# Patient Record
Sex: Female | Born: 1995 | Race: Black or African American | Hispanic: No | Marital: Single | Smoking: Never smoker
Health system: Southern US, Community
[De-identification: ages and names within clinical notes are randomized; demographics above are authoritative.]

## PROBLEM LIST (undated history)

## (undated) DIAGNOSIS — J45909 Unspecified asthma, uncomplicated: Secondary | ICD-10-CM

## (undated) DIAGNOSIS — F419 Anxiety disorder, unspecified: Secondary | ICD-10-CM

## (undated) DIAGNOSIS — F32A Depression, unspecified: Secondary | ICD-10-CM

## (undated) DIAGNOSIS — T783XXA Angioneurotic edema, initial encounter: Secondary | ICD-10-CM

## (undated) DIAGNOSIS — L509 Urticaria, unspecified: Secondary | ICD-10-CM

## (undated) HISTORY — DX: Anxiety disorder, unspecified: F41.9

## (undated) HISTORY — DX: Depression, unspecified: F32.A

## (undated) HISTORY — PX: APPENDECTOMY: SHX54

## (undated) HISTORY — DX: Angioneurotic edema, initial encounter: T78.3XXA

## (undated) HISTORY — DX: Urticaria, unspecified: L50.9

---
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
## 2017-01-24 DIAGNOSIS — M545 Low back pain: Secondary | ICD-10-CM | POA: Diagnosis not present

## 2017-01-24 DIAGNOSIS — B9689 Other specified bacterial agents as the cause of diseases classified elsewhere: Secondary | ICD-10-CM | POA: Diagnosis not present

## 2017-01-24 DIAGNOSIS — N760 Acute vaginitis: Secondary | ICD-10-CM | POA: Diagnosis not present

## 2017-01-24 DIAGNOSIS — R102 Pelvic and perineal pain: Secondary | ICD-10-CM | POA: Diagnosis not present

## 2017-02-07 ENCOUNTER — Encounter: Payer: Self-pay | Admitting: Physician Assistant

## 2017-02-20 ENCOUNTER — Ambulatory Visit: Payer: 59 | Admitting: Physician Assistant

## 2017-02-26 DIAGNOSIS — N760 Acute vaginitis: Secondary | ICD-10-CM | POA: Diagnosis not present

## 2017-02-26 DIAGNOSIS — R142 Eructation: Secondary | ICD-10-CM | POA: Diagnosis not present

## 2017-02-26 DIAGNOSIS — R110 Nausea: Secondary | ICD-10-CM | POA: Diagnosis not present

## 2017-02-26 DIAGNOSIS — K219 Gastro-esophageal reflux disease without esophagitis: Secondary | ICD-10-CM | POA: Diagnosis not present

## 2017-02-26 DIAGNOSIS — E7800 Pure hypercholesterolemia, unspecified: Secondary | ICD-10-CM | POA: Diagnosis not present

## 2017-02-26 DIAGNOSIS — E559 Vitamin D deficiency, unspecified: Secondary | ICD-10-CM | POA: Diagnosis not present

## 2017-02-26 DIAGNOSIS — N200 Calculus of kidney: Secondary | ICD-10-CM | POA: Diagnosis not present

## 2017-04-16 DIAGNOSIS — R5383 Other fatigue: Secondary | ICD-10-CM | POA: Diagnosis not present

## 2017-04-16 DIAGNOSIS — Z23 Encounter for immunization: Secondary | ICD-10-CM | POA: Diagnosis not present

## 2017-04-16 DIAGNOSIS — J00 Acute nasopharyngitis [common cold]: Secondary | ICD-10-CM | POA: Diagnosis not present

## 2017-04-16 DIAGNOSIS — R05 Cough: Secondary | ICD-10-CM | POA: Diagnosis not present

## 2017-07-03 DIAGNOSIS — F329 Major depressive disorder, single episode, unspecified: Secondary | ICD-10-CM | POA: Diagnosis not present

## 2017-07-03 DIAGNOSIS — F410 Panic disorder [episodic paroxysmal anxiety] without agoraphobia: Secondary | ICD-10-CM | POA: Diagnosis not present

## 2017-07-03 DIAGNOSIS — L731 Pseudofolliculitis barbae: Secondary | ICD-10-CM | POA: Diagnosis not present

## 2018-03-19 IMAGING — US US ART/VEN ABD/PELV/SCROTUM DOPPLER LTD
1 series · 14 of 25 positions shown · non-contrast
Comparison: None.

CLINICAL DATA: Lower abdominal/pelvis pain on the right

EXAM:
TRANSABDOMINAL ULTRASOUND OF PELVIS
DOPPLER ULTRASOUND OF OVARIES
TECHNIQUE: Transabdominal ultrasound examination of the pelvis was performed
including evaluation of the uterus, ovaries, adnexal regions, and
pelvic cul-de-sac.
Color and duplex Doppler ultrasound was utilized to evaluate blood
flow to the ovaries.

[Series 1: us art/ven abd/pelv/scrotum doppler ltd · 0.24mm/px · 14 of 36 slices shown]
[im 1/36]
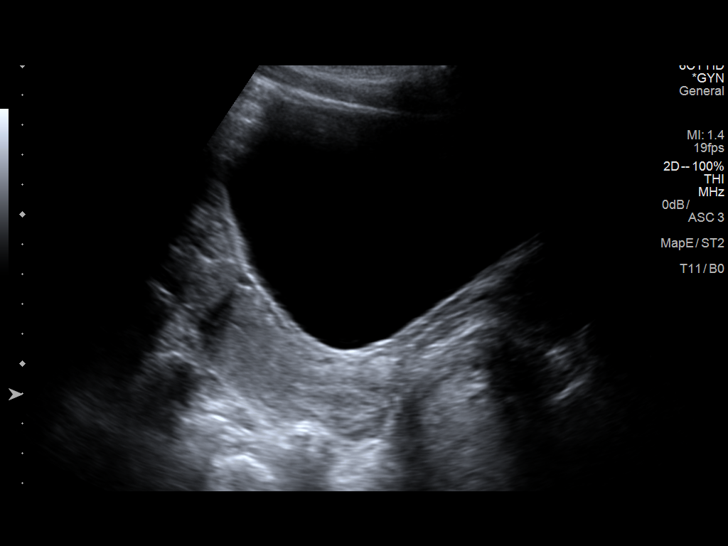
[im 3/36]
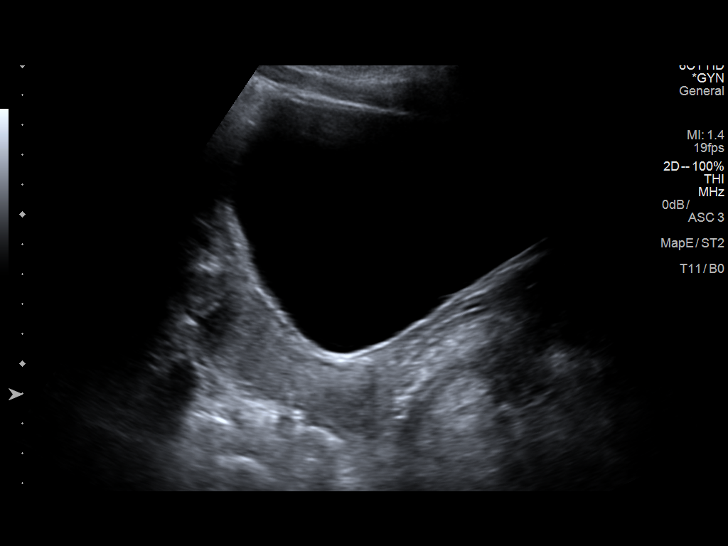
[im 6/36]
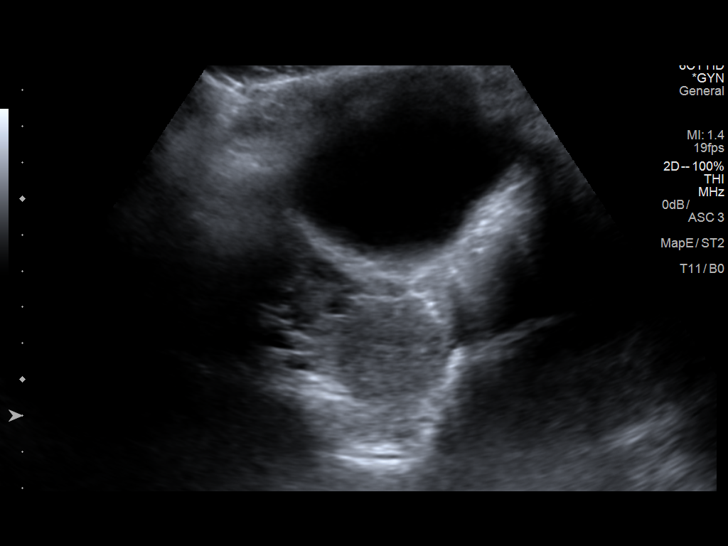
[im 9/36]
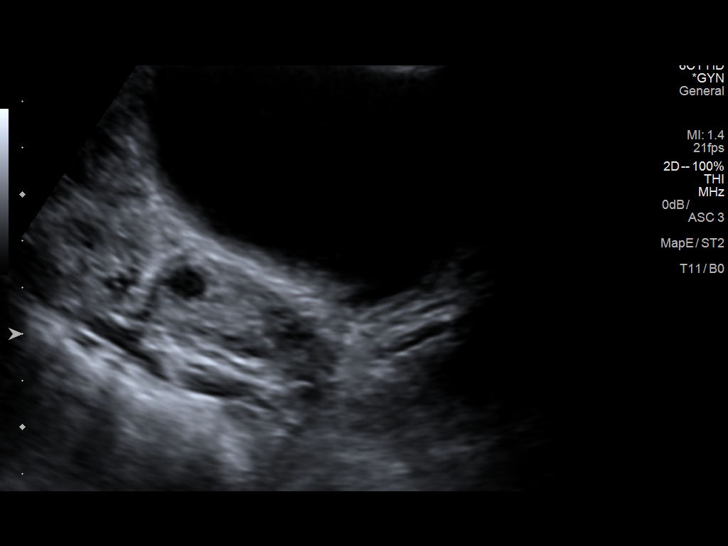
[im 12/36]
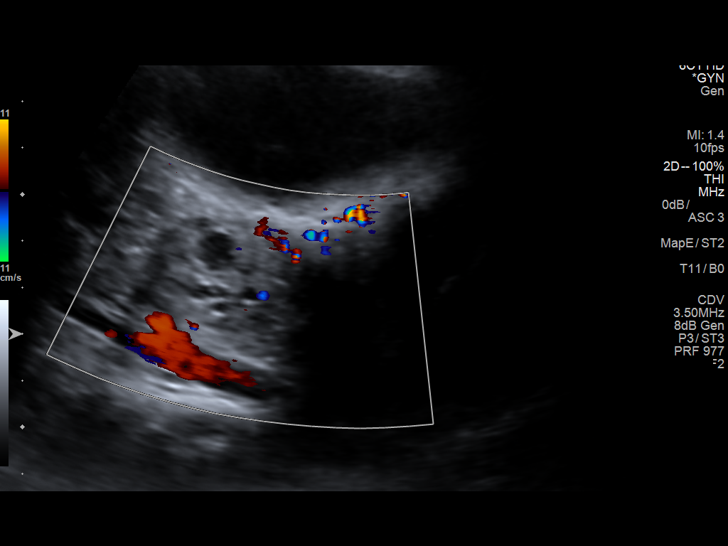
[im 14/36]
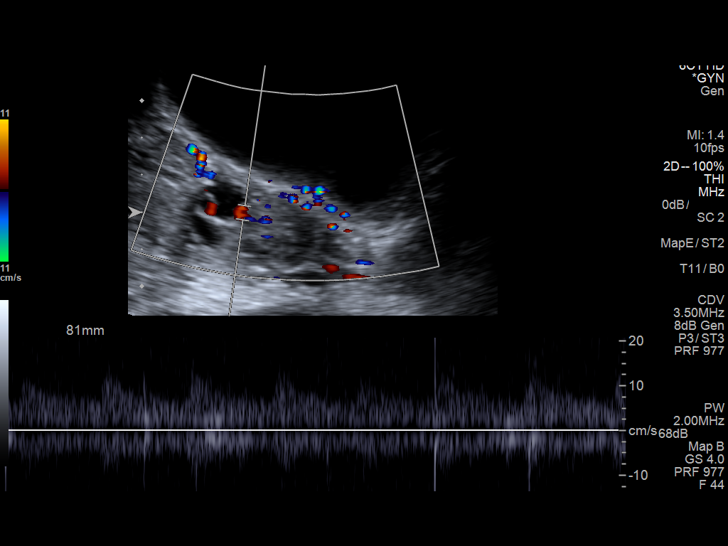
[im 17/36]
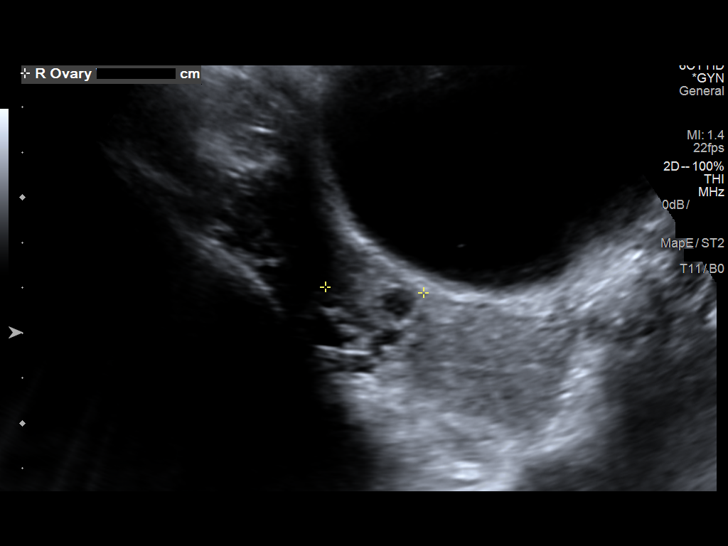
[im 19/36]
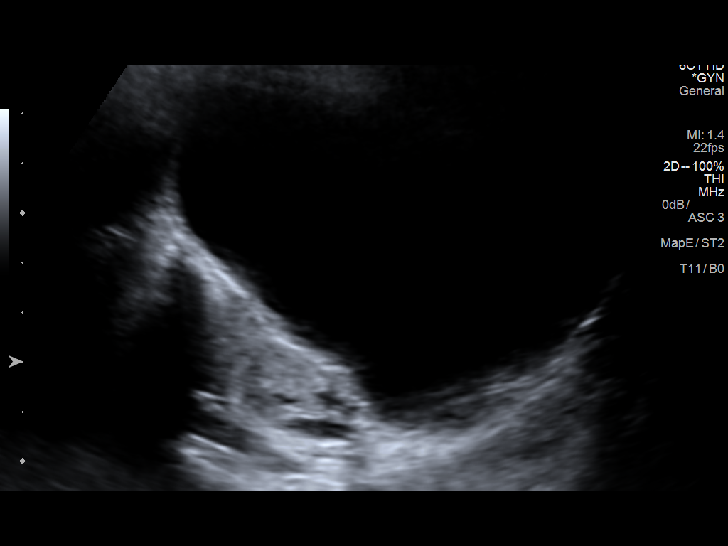
[im 22/36]
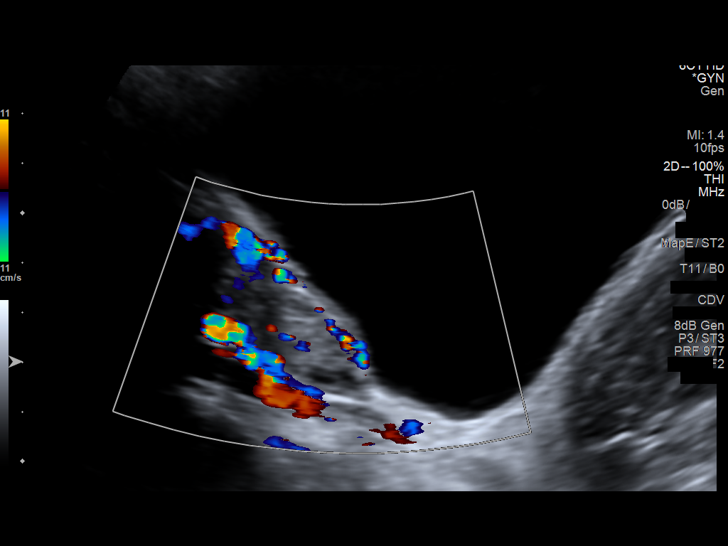
[im 24/36]
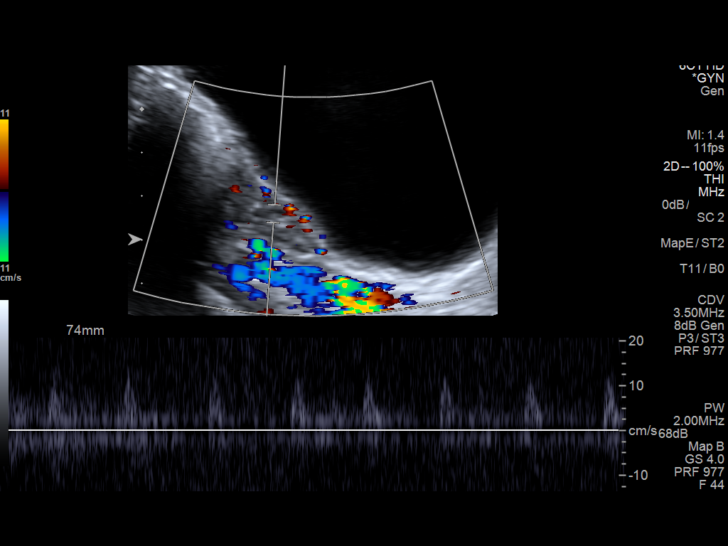
[im 27/36]
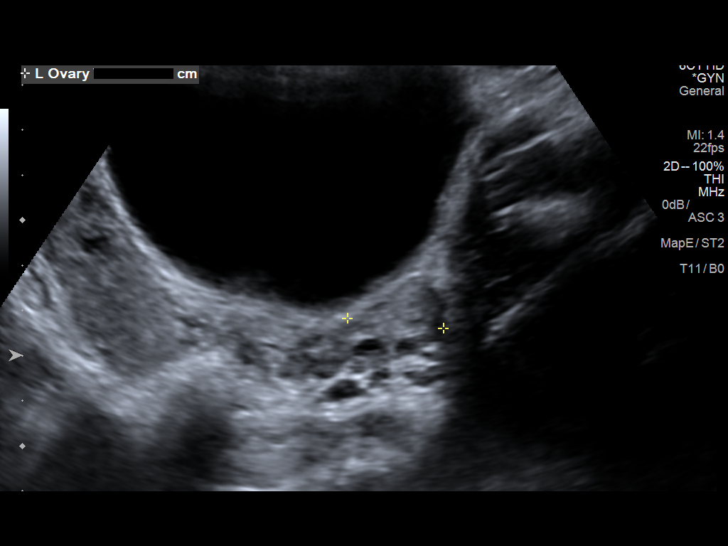
[im 30/36]
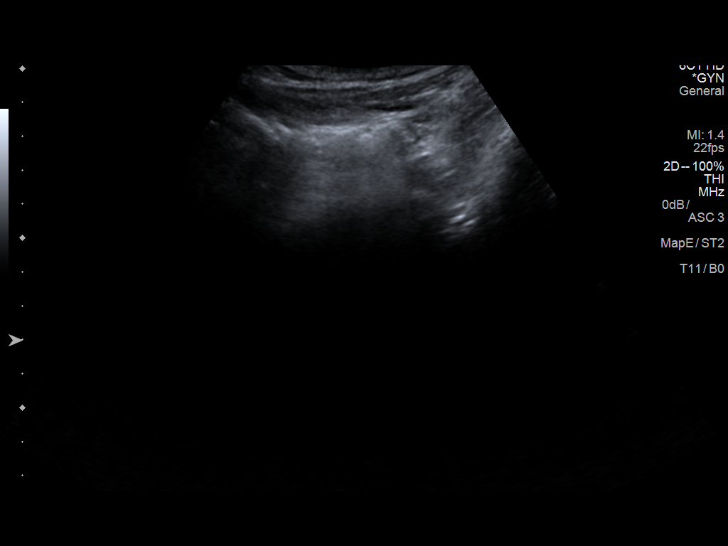
[im 33/36]
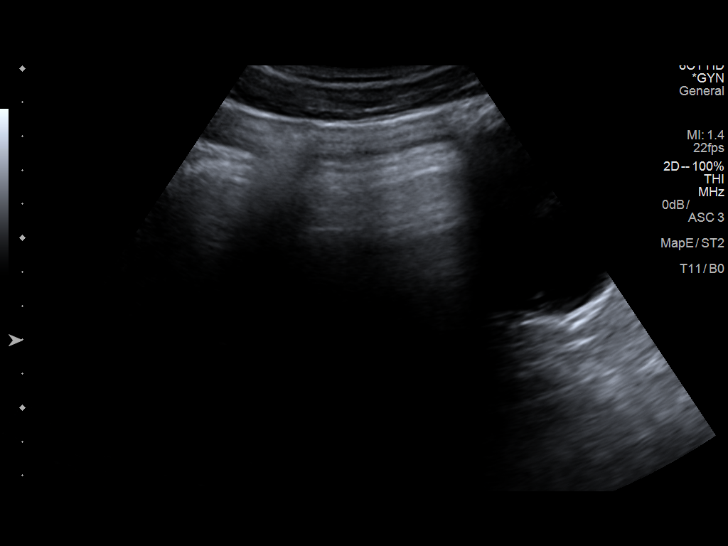
[im 36/36]
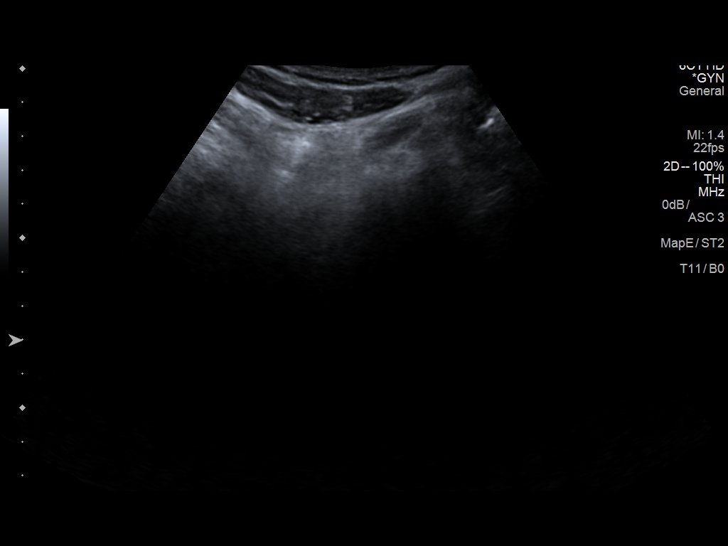

[14 of 25 positions shown; findings below may reference images not displayed]

FINDINGS: Uterus

Measurements: 7.0 x 2.4 x 4.6 cm. No fibroids or other mass
visualized. Uterus is anteverted.

Endometrium

Thickness: 3 mm. No focal abnormality visualized.

Right ovary

Measurements: 3.9 x 2.0 x 2.2 cm. Normal appearance/no adnexal mass.
Physiologic follicles are noted.

Left ovary

Measurements: 2.8 x 1.7 x 2.1 cm. Normal appearance/no adnexal mass.
Physiologic follicles are noted.

Pulsed Doppler evaluation demonstrates normal low-resistance
arterial and venous waveforms in both ovaries.

No free pelvic fluid.
IMPRESSION: No demonstrable intrauterine or extrauterine pelvic mass or fluid
collection. No evidence of ovarian torsion. No abnormality
appreciable on this study.

## 2018-10-17 ENCOUNTER — Telehealth: Payer: 59 | Admitting: Family

## 2018-10-17 DIAGNOSIS — L509 Urticaria, unspecified: Secondary | ICD-10-CM

## 2018-10-17 NOTE — Progress Notes (Signed)
Based on what you shared with me, I feel your condition warrants further evaluation and I recommend that you be seen for a face to face office visit.     NOTE: If you entered your credit card information for this eVisit, you will not be charged. You may see a "hold" on your card for the $35 but that hold will drop off and you will not have a charge processed.  If you are having a true medical emergency please call 911.  If you need an urgent face to face visit, [place] has four urgent care centers for your convenience.    PLEASE NOTE: THE [facility] AND URGENT CARE CLINICS DO NOT HAVE THE TESTING FOR CORONAVIRUS COVID19 AVAILABLE.  IF YOU FEEL YOU NEED THIS TEST YOU MUST GO TO A TRIAGE LOCATION AT ONE OF THE HOSPITAL EMERGENCY DEPARTMENTS   [email or web address] to reserve your spot online an avoid wait times  [facility] [address], [address], [place] [address] Modified hours of operation: Monday-Friday, 10 AM to 6 PM  Saturday & Sunday 10 AM to 4 PM *Across the street from [employer]  [facility] (New Address!) [address], [address] [place], [place] [address] *Just off [address], across the road from [employer]* Modified hours of operation: Monday-Friday, 10 AM to 5 PM  Closed Saturday & Sunday   The following sites will take your insurance:  . [facility]  [phone] Get Driving Directions Find a Provider at this Location  [address] [place], [place] [address] . 10 am to 8 pm Monday-Friday . 12 pm to 8 pm Saturday-Sunday   . [facility]  [phone] Get Driving Directions Find a Provider at this Location  [address], [address] [place] [place] [address] . 8 am to 8 pm Monday-Friday . 9 am to 6 pm Saturday . 11 am to 6 pm Sunday   . [facility] [place]  [phone] Get Driving Directions  [address] [place],  [address] . 8 am to 8 pm Monday-Friday . 8 am to 4 pm Saturday-Sunday   Your e-visit answers were reviewed by a board certified advanced clinical practitioner to complete your personal care plan.  Thank you for using e-Visits. 

## 2018-10-18 ENCOUNTER — Other Ambulatory Visit: Payer: Self-pay

## 2018-10-18 ENCOUNTER — Encounter (HOSPITAL_COMMUNITY): Payer: Self-pay

## 2018-10-18 ENCOUNTER — Ambulatory Visit (HOSPITAL_COMMUNITY)
Admission: EM | Admit: 2018-10-18 | Discharge: 2018-10-18 | Disposition: A | Discharge status: Home / Self Care | Payer: 59 | Attending: Emergency Medicine | Admitting: Emergency Medicine

## 2018-10-18 DIAGNOSIS — J069 Acute upper respiratory infection, unspecified: Secondary | ICD-10-CM | POA: Insufficient documentation

## 2018-10-18 DIAGNOSIS — B9789 Other viral agents as the cause of diseases classified elsewhere: Secondary | ICD-10-CM | POA: Insufficient documentation

## 2018-10-18 LAB — POCT RAPID STREP A: Streptococcus, Group A Screen (Direct): NEGATIVE

## 2018-10-18 MED ORDER — CETIRIZINE-PSEUDOEPHEDRINE ER 5-120 MG PO TB12: 1.0000 | ORAL_TABLET | Freq: Every day | ORAL | 0 refills | Status: DC

## 2018-10-18 MED ORDER — FLUTICASONE PROPIONATE 50 MCG/ACT NA SUSP: 2.0000 | Freq: Every day | NASAL | 0 refills | Status: DC

## 2018-10-18 MED ORDER — LIDOCAINE VISCOUS HCL 2 % MT SOLN: 15.0000 mL | OROMUCOSAL | 0 refills | Status: DC | PRN

## 2018-10-18 NOTE — Discharge Instructions (Signed)
Strep was negative.  We will send out to culture and notify you of abnormal results.   Get plenty of rest and push fluids Zyrtec-D prescribed for nasal congestion, runny nose, and/or sore throat Flonase prescribed for nasal congestion and runny nose Viscous lidocaine prescribed.  This is an oral solution you can swish, gargle, and/or swallow as needed for symptomatic relief of sore throat.  Do not exceed 8 doses in a 24 hour period.  Do not use prior to eating, as this will numb your entire mouth.    Use OTC medications like ibuprofen or tylenol as needed fever or pain Follow up with PCP or [facility] if symptoms persist Return or go to ER if you have any new or worsening symptoms fever, chills, nausea, vomiting, chest pain, cough, shortness of breath, wheezing, abdominal pain, changes in bowel or bladder habits, etc...  You should remain isolated in your home for 7 days from symptom onset AND greater than 72 hours after symptoms resolution (absence of fever without the use of fever-reducing medication and improvement in respiratory symptoms), whichever is longer

## 2018-10-18 NOTE — ED Triage Notes (Signed)
Patient presents to Urgent Care with complaints of flu like symptoms, including generalized body aches, runny nose, sore throat, chills since 3-4 days ago. Patient states she has been taking lots of otc meds for her symptoms and nothing really helps, did not take anything today. Temp 99.1 during triage.

## 2018-10-18 NOTE — ED Notes (Signed)
Patient verbalizes understanding of discharge instructions. Opportunity for questioning and answers were provided. Patient discharged from U[facility] by RN.  

## 2018-10-18 NOTE — ED Provider Notes (Signed)
[facility]   [id number] 10/18/18 Arrival Time: 1037   CC: URI symptoms   SUBJECTIVE: History from: patient.  Carol Hayes is a 23 y.o. female who presents with sore throat, fever, with tmax of 101 at home, HA, body aches, runny nose, congestion, and mild cough x 1 week.  Denies sick exposure to COVID, flu or strep.  Denies recent travel. Has tried OTC medications with minimal relief.  Symptoms are made worse with swallowing, but tolerating liquids and own secretions.  Reports previous symptoms in the past.   Denies sinus pain, SOB, wheezing, chest pain, nausea, changes in bowel or bladder habits.    ROS: As per HPI.  Past Medical History:  Diagnosis Date  . Asthma    Past Surgical History:  Procedure Laterality Date  . APPENDECTOMY     No Known Allergies No current facility-administered medications on file prior to encounter.    Current Outpatient Medications on File Prior to Encounter  Medication Sig Dispense Refill  . albuterol (PROVENTIL HFA;VENTOLIN HFA) 108 (90 Base) MCG/ACT inhaler Inhale into the lungs every 6 (six) hours as needed for wheezing or shortness of breath.     Social History   Socioeconomic History  . Marital status: Single    Spouse name: Not on file  . Number of children: Not on file  . Years of education: Not on file  . Highest education level: Not on file  Occupational History  . Not on file  Social Needs  . Financial resource strain: Not on file  . Food insecurity:    Worry: Not on file    Inability: Not on file  . Transportation needs:    Medical: Not on file    Non-medical: Not on file  Tobacco Use  . Smoking status: Never Smoker  . Smokeless tobacco: Never Used  Substance and Sexual Activity  . Alcohol use: No  . Drug use: No  . Sexual activity: Not on file  Lifestyle  . Physical activity:    Days per week: Not on file    Minutes per session: Not on file  . Stress: Not on file  Relationships  . Social  connections:    Talks on phone: Not on file    Gets together: Not on file    Attends religious service: Not on file    Active member of club or organization: Not on file    Attends meetings of clubs or organizations: Not on file    Relationship status: Not on file  . Intimate partner violence:    Fear of current or ex partner: Not on file    Emotionally abused: Not on file    Physically abused: Not on file    Forced sexual activity: Not on file  Other Topics Concern  . Not on file  Social History Narrative  . Not on file   Family History  Problem Relation Age of Onset  . Asthma Mother     OBJECTIVE:  Vitals:   10/18/18 1101  BP: 117/69  Pulse: 76  Resp: 17  Temp: 99.1 F (37.3 C)  TempSrc: Oral  SpO2: 100%     General appearance: alert; appears mildly fatigued, but nontoxic; speaking in full sentences and tolerating own secretions HEENT: NCAT; Ears: EACs clear, TMs pearly gray; Eyes: PERRL.  EOM grossly intact. Sinuses: nontender; Nose: nares patent without rhinorrhea, Throat: oropharynx clear, tonsils mildly erythematous NOT enlarged, uvula midline  Neck: supple without LAD Lungs: unlabored respirations, symmetrical air entry;  cough: absent; no respiratory distress; CTAB Heart: regular rate and rhythm.  Radial pulses 2+ symmetrical bilaterally Skin: warm and dry Psychological: alert and cooperative; normal mood and affect  RESULTS:  Results for orders placed or performed during the hospital encounter of 10/18/18 (from the past 24 hour(s))  POCT rapid strep A [facility])     Status: None   Collection Time: 10/18/18 11:31 AM  Result Value Ref Range   Streptococcus, Group A Screen (Direct) NEGATIVE NEGATIVE     ASSESSMENT & PLAN:  1. Viral URI with cough     Meds ordered this encounter  Medications  . cetirizine-pseudoephedrine (ZYRTEC-D) 5-120 MG tablet    Sig: Take 1 tablet by mouth daily.    Dispense:  30 tablet    Refill:  0    Order Specific  Question:   Supervising Provider    Answer:   Eustace Moore [[id number]]  . fluticasone (FLONASE) 50 MCG/ACT nasal spray    Sig: Place 2 sprays into both nostrils daily.    Dispense:  16 g    Refill:  0    Order Specific Question:   Supervising Provider    Answer:   Eustace Moore [[id number]]  . lidocaine (XYLOCAINE) 2 % solution    Sig: Use as directed 15 mLs in the mouth or throat as needed for mouth pain.    Dispense:  100 mL    Refill:  0    Order Specific Question:   Supervising Provider    Answer:   Eustace Moore [[id number]]   Strep was negative.  We will send out to culture and notify you of abnormal results.   Get plenty of rest and push fluids Zyrtec-D prescribed for nasal congestion, runny nose, and/or sore throat Flonase prescribed for nasal congestion and runny nose Viscous lidocaine prescribed.  This is an oral solution you can swish, gargle, and/or swallow as needed for symptomatic relief of sore throat.  Do not exceed 8 doses in a 24 hour period.  Do not use prior to eating, as this will numb your entire mouth.    Use OTC medications like ibuprofen or tylenol as needed fever or pain Follow up with PCP or [facility] if symptoms persist Return or go to ER if you have any new or worsening symptoms fever, chills, nausea, vomiting, chest pain, cough, shortness of breath, wheezing, abdominal pain, changes in bowel or bladder habits, etc...  You should remain isolated in your home for 7 days from symptom onset AND greater than 72 hours after symptoms resolution (absence of fever without the use of fever-reducing medication and improvement in respiratory symptoms), whichever is longer  Reviewed expectations re: course of current medical issues. Questions answered. Outlined signs and symptoms indicating need for more acute intervention. Patient verbalized understanding. After Visit Summary given.         Rennis Harding, PA-C 10/18/18 1135

## 2018-10-19 ENCOUNTER — Emergency Department (HOSPITAL_COMMUNITY): Payer: 59

## 2018-10-19 ENCOUNTER — Other Ambulatory Visit: Payer: Self-pay

## 2018-10-19 ENCOUNTER — Encounter (HOSPITAL_COMMUNITY): Payer: Self-pay

## 2018-10-19 ENCOUNTER — Emergency Department (HOSPITAL_COMMUNITY)
Admission: EM | Admit: 2018-10-19 | Discharge: 2018-10-19 | Disposition: A | Discharge status: Home / Self Care | Payer: 59 | Attending: Emergency Medicine | Admitting: Emergency Medicine

## 2018-10-19 DIAGNOSIS — J069 Acute upper respiratory infection, unspecified: Secondary | ICD-10-CM

## 2018-10-19 DIAGNOSIS — B9789 Other viral agents as the cause of diseases classified elsewhere: Secondary | ICD-10-CM | POA: Diagnosis not present

## 2018-10-19 DIAGNOSIS — R05 Cough: Secondary | ICD-10-CM | POA: Diagnosis present

## 2018-10-19 DIAGNOSIS — J45909 Unspecified asthma, uncomplicated: Secondary | ICD-10-CM | POA: Insufficient documentation

## 2018-10-19 DIAGNOSIS — Z91040 Latex allergy status: Secondary | ICD-10-CM | POA: Diagnosis not present

## 2018-10-19 LAB — I-STAT BETA HCG BLOOD, ED (MC, WL, AP ONLY): I-stat hCG, quantitative: <5 m[IU]/mL (ref <5)

## 2018-10-19 MED ORDER — AEROCHAMBER PLUS FLO-VU MISC
1.0000 | Freq: Once | Status: AC
  Administered 2018-10-19: 1

## 2018-10-19 MED ORDER — ALBUTEROL SULFATE HFA 108 (90 BASE) MCG/ACT IN AERS
1.0000 | INHALATION_SPRAY | RESPIRATORY_TRACT | Status: DC | PRN
  Administered 2018-10-19: 2 via RESPIRATORY_TRACT
  Filled 2018-10-19: qty 6.7

## 2018-10-19 MED ORDER — BENZONATATE 100 MG PO CAPS: 100.0000 mg | ORAL_CAPSULE | Freq: Three times a day (TID) | ORAL | 0 refills | Status: DC

## 2018-10-19 NOTE — ED Notes (Signed)
Pts throat red on assessment, no white spots.

## 2018-10-19 NOTE — Discharge Instructions (Addendum)
You have been diagnosed today with viral upper respiratory tract infection with cough.  At this time there does not appear to be the presence of an emergent medical condition, however there is always the potential for conditions to change. Please read and follow the below instructions.  Please return to the Emergency Department immediately for any new or worsening symptoms. Please be sure to follow up with your Primary Care Provider within one week regarding your visit today; please call their office to schedule an appointment even if you are feeling better for a follow-up visit. As we discussed testing for the novel COVID-19 virus is not indicated at this time.  As testing becomes more available this may change however per our current guidelines testing is not indicated.  It is possible that you are still experiencing the coronavirus so we advise that you self quarantine for the next 2 weeks and follow-up with your primary care provider.  You may continue to use your albuterol inhaler and spacer as previously prescribed to help with your symptoms.  Increase your water intake and get plenty of rest to avoid dehydration.  You may use over-the-counter Tylenol as directed on the packaging to help with your symptoms.  You may use the Tessalon prescribed to you today to help with your cough.  You may continue to use the other medications prescribed to you yesterday at the urgent care to help with your sore throat.  Return immediately to the emergency department for any new or worsening symptoms.  Please read the instructions below regarding self quarantine.  Get help right away if: You have shortness of breath that gets worse. You have severe or persistent: Headache. Ear pain. Sinus pain. Chest pain. You have chronic lung disease along with any of the following: Wheezing. Prolonged cough. Coughing up blood. A change in your usual mucus. You have a stiff neck. You have changes in  your: Vision. Hearing. Thinking. Mood.  Please read the additional information packets attached to your discharge summary.  Do not take your medicine if  develop an itchy rash, swelling in your mouth or lips, or difficulty breathing.  ============================  If you live with, or provide care at home for, a person confirmed to have, or being evaluated for, COVID-19 infection please follow these guidelines to prevent infection:  Follow healthcare providers instructions Make sure that you understand and can help the patient follow any healthcare provider instructions for all care.  Provide for the patients basic needs You should help the patient with basic needs in the home and provide support for getting groceries, prescriptions, and other personal needs.  Monitor the patients symptoms If they are getting sicker, call his or her medical provider a  This will help the healthcare providers office take steps to keep other people from getting infected. Ask the healthcare provider to call the local or state health department.  Limit the number of people who have contact with the patient If possible, have only one caregiver for the patient. Other household members should stay in another home or place of residence. If this is not possible, they should stay in another room, or be separated from the patient as much as possible. Use a separate bathroom, if available. Restrict visitors who do not have an essential need to be in the home.  Keep older adults, very young children, and other sick people away from the patient Keep older adults, very young children, and those who have compromised immune systems or chronic health conditions  away from the patient. This includes people with chronic heart, lung, or kidney conditions, diabetes, and cancer.  Ensure good ventilation Make sure that shared spaces in the home have good air flow, such as from an air conditioner or an opened  window, weather permitting.  Wash your hands often Wash your hands often and thoroughly with soap and water for at least 20 seconds. You can use an alcohol based hand sanitizer if soap and water are not available and if your hands are not visibly dirty. Avoid touching your eyes, nose, and mouth with unwashed hands. Use disposable paper towels to dry your hands. If not available, use dedicated cloth towels and replace them when they become wet.  Wear a facemask and gloves Wear a disposable facemask at all times in the room and gloves when you touch or have contact with the patients blood, body fluids, and/or secretions or excretions, such as sweat, saliva, sputum, nasal mucus, vomit, urine, or feces.  Ensure the mask fits over your nose and mouth tightly, and do not touch it during use. Throw out disposable facemasks and gloves after using them. Do not reuse. Wash your hands immediately after removing your facemask and gloves. If your personal clothing becomes contaminated, carefully remove clothing and launder. Wash your hands after handling contaminated clothing. Place all used disposable facemasks, gloves, and other waste in a lined container before disposing them with other household waste. Remove gloves and wash your hands immediately after handling these items.  Do not share dishes, glasses, or other household items with the patient Avoid sharing household items. You should not share dishes, drinking glasses, cups, eating utensils, towels, bedding, or other items After the person uses these items, you should wash them thoroughly with soap and water.  Wash laundry thoroughly Immediately remove and wash clothes or bedding that have blood, body fluids, and/or secretions or excretions, such as sweat, saliva, sputum, nasal mucus, vomit, urine, or feces, on them. Wear gloves when handling laundry from the patient. Read and follow directions on labels of laundry or clothing items and detergent.  In general, wash and dry with the warmest temperatures recommended on the label.  Clean all areas the individual has used often Clean all touchable surfaces, such as counters, tabletops, doorknobs, bathroom fixtures, toilets, phones, keyboards, tablets, and bedside tables, every day. Also, clean any surfaces that may have blood, body fluids, and/or secretions or excretions on them. Wear gloves when cleaning surfaces the patient has come in contact with. Use a diluted bleach solution (e.g., dilute bleach with 1 part bleach and 10 parts water) or a household disinfectant with a label that says EPA-registered for coronaviruses. To make a bleach solution at home, add 1 tablespoon of bleach to 1 quart (4 cups) of water. For a larger supply, add  cup of bleach to 1 gallon (16 cups) of water. Read labels of cleaning products and follow recommendations provided on product labels. Labels contain instructions for safe and effective use of the cleaning product including precautions you should take when applying the product, such as wearing gloves or eye protection and making sure you have good ventilation during use of the product. Remove gloves and wash hands immediately after cleaning.  Monitor yourself for signs and symptoms of illness Caregivers and household members are considered close contacts, should monitor their health, and will be asked to limit movement outside of the home to the extent possible. Follow the monitoring steps for close contacts listed on the symptom monitoring form.   ?  If you have additional questions, contact your local health department or call the epidemiologist on call at [phone] (available 24/7). ? This guidance is subject to change. For the most up-to-date guidance from [facility], please refer to their website: TripMetro.huhttps://www.cdc.gov/coronavirus/2019-ncov/hcp/guidance-prevent-spread.html

## 2018-10-19 NOTE — ED Provider Notes (Signed)
[facility]-EMERGENCY DEPT Provider Note   [id number] Arrival date & time: 10/19/18  1630    History   Chief Complaint Chief Complaint  Patient presents with  . Cough  . Generalized Body Aches  . Sore Throat    HPI Rielle Schlauch is a 23 y.o. female with history of asthma presenting today for 1 week of viral illness.  Patient reports that on 10/12/2018 she developed cough and shortness of breath that felt similar to previous asthma exacerbations.  Patient began using her rescue inhaler with some relief of her symptoms.  She states that these symptoms lasted for 2-3 days and begin resolving.  At this time she still endorses a mild nonproductive cough and intermittent shortness of breath particularly only when she coughs.  Additionally patient states that she feels a chest tightness/soreness only with her cough and is worsened with palpation as well.  She denies chest pain at rest.  Patient endorses generalized body aches without focal area of tenderness.  Patient states that she has felt febrile however is not measured a temperature today.  She reports taking Tylenol at 3 PM this afternoon.  Finally patient Dors is sore throat, bilateral burning sensation constant worsened with swallowing and slightly relieved medications given to her at urgent care yesterday.  Patient tested negative for strep pharyngitis yesterday at the urgent care.  She has been eating and drinking without difficulty she denies difficulty swallowing or difficulty breathing on my examination.  Of note patient denies any recent contacts with patients known positive COVID-19.  She denies any recent travel.   Additionally patient denies history of blood clot, recent immobilizations/surgeries, hemoptysis, history of cancer, exogenous estrogen use, extremity pain/swelling or recent injury.     HPI  Past Medical History:  Diagnosis Date  . Asthma     There are no active problems to display for  this patient.   Past Surgical History:  Procedure Laterality Date  . APPENDECTOMY       OB History   No obstetric history on file.      Home Medications    Prior to Admission medications   Medication Sig Start Date End Date Taking? Authorizing Provider  albuterol (PROVENTIL HFA;VENTOLIN HFA) 108 (90 Base) MCG/ACT inhaler Inhale into the lungs every 6 (six) hours as needed for wheezing or shortness of breath.    [provider]  cetirizine-pseudoephedrine (ZYRTEC-D) 5-120 MG tablet Take 1 tablet by mouth daily. 10/18/18   Wurst, [place], PA-C  fluticasone (FLONASE) 50 MCG/ACT nasal spray Place 2 sprays into both nostrils daily. 10/18/18   Wurst, [place], PA-C  lidocaine (XYLOCAINE) 2 % solution Use as directed 15 mLs in the mouth or throat as needed for mouth pain. 10/18/18   Rennis Harding, PA-C    Family History Family History  Problem Relation Age of Onset  . Asthma Mother     Social History Social History   Tobacco Use  . Smoking status: Never Smoker  . Smokeless tobacco: Never Used  Substance Use Topics  . Alcohol use: No  . Drug use: No     Allergies   Latex   Review of Systems Review of Systems  Constitutional: Positive for chills and fever. Negative for diaphoresis.  HENT: Positive for congestion, rhinorrhea and sore throat. Negative for drooling, facial swelling, trouble swallowing and voice change.   Eyes: Negative.  Negative for visual disturbance.  Respiratory: Positive for cough, chest tightness and shortness of breath (Reports as improving over the  past few days).   Cardiovascular: Negative.  Negative for chest pain (Reports chest tightness when coughing only).  Gastrointestinal: Negative.  Negative for abdominal pain, diarrhea, nausea and vomiting.  Genitourinary: Negative.  Negative for dysuria, hematuria, vaginal bleeding and vaginal discharge.  Musculoskeletal: Positive for arthralgias and myalgias. Negative for neck pain and neck  stiffness.  Neurological: Positive for headaches (Mild earlier today it has resolved). Negative for weakness and numbness.  All other systems reviewed and are negative.  Physical Exam Updated Vital Signs BP 124/72   Pulse 90   Temp 98.8 F (37.1 C) (Oral)   Resp (!) 21   Ht 5\' 10"  (1.778 m)   Wt 62.6 kg   SpO2 99%   BMI 19.80 kg/m   Physical Exam Constitutional:      General: She is not in acute distress.    Appearance: Normal appearance. She is well-developed. She is not ill-appearing or diaphoretic.  HENT:     Head: Normocephalic and atraumatic.     Jaw: There is normal jaw occlusion. No trismus.     Right Ear: External ear normal.     Left Ear: External ear normal.     Nose: Rhinorrhea present. Rhinorrhea is clear.     Right Sinus: No maxillary sinus tenderness or frontal sinus tenderness.     Left Sinus: No maxillary sinus tenderness or frontal sinus tenderness.     Mouth/Throat:     Lips: Pink.     Mouth: Mucous membranes are moist.     Pharynx: Uvula midline.     Comments: The patient has normal phonation and is in control of secretions. No stridor.  Midline uvula without edema. Soft palate rises symmetrically.  Mild symmetric tonsillar erythema without swelling or exudates. Tongue protrusion is normal, floor of mouth is soft. No trismus. No creptius on neck palpation. No gingival erythema or fluctuance noted. Mucus membranes moist. Eyes:     General: Vision grossly intact. Gaze aligned appropriately.     Extraocular Movements: Extraocular movements intact.     Conjunctiva/sclera: Conjunctivae normal.     Pupils: Pupils are equal, round, and reactive to light.     Comments: Visual fields grossly intact bilaterally.  Neck:     Musculoskeletal: Full passive range of motion without pain, normal range of motion and neck supple.     Trachea: Trachea and phonation normal. No tracheal tenderness or tracheal deviation.     Meningeal: Brudzinski's sign absent.   Cardiovascular:     Rate and Rhythm: Normal rate and regular rhythm.     Pulses:          Dorsalis pedis pulses are 2+ on the right side and 2+ on the left side.       Posterior tibial pulses are 2+ on the right side and 2+ on the left side.     Heart sounds: Normal heart sounds.  Pulmonary:     Effort: Pulmonary effort is normal. No tachypnea, accessory muscle usage or respiratory distress.     Breath sounds: Normal breath sounds and air entry. No stridor.  Chest:     Chest wall: Tenderness present. No deformity or crepitus.     Comments: Mild tenderness to palpation of the rib cage. Abdominal:     General: There is no distension.     Palpations: Abdomen is soft.     Tenderness: There is no abdominal tenderness. There is no guarding or rebound.  Musculoskeletal: Normal range of motion.     Right  lower leg: Normal. She exhibits no tenderness. No edema.     Left lower leg: Normal. She exhibits no tenderness. No edema.  Feet:     Right foot:     Protective Sensation: 5 sites tested. 5 sites sensed.     Left foot:     Protective Sensation: 5 sites tested. 5 sites sensed.  Skin:    General: Skin is warm and dry.  Neurological:     Mental Status: She is alert.     GCS: GCS eye subscore is 4. GCS verbal subscore is 5. GCS motor subscore is 6.     Comments: Speech is clear and goal oriented, follows commands Major Cranial nerves without deficit, no facial droop Normal strength in upper and lower extremities bilaterally including dorsiflexion and plantar flexion, strong and equal grip strength Sensation normal to light touch in all 4 extremities Moves extremities without ataxia, coordination intact, is able to stand up from bed without assistance Normal finger to nose and rapid alternating movements Neg romberg, no pronator drift Normal gait  Psychiatric:        Behavior: Behavior normal.    ED Treatments / Results  Labs (all labs ordered are listed, but only abnormal results are  displayed) Labs Reviewed  I-STAT BETA HCG BLOOD, ED ([facility], [facility], [facility] ONLY)    EKG EKG Interpretation  Date/Time:  Sunday October 19 2018 17:05:56 EDT Ventricular Rate:  92 PR Interval:    QRS Duration: 88 QT Interval:  330 QTC Calculation: 409 R Axis:   85 Text Interpretation:  Sinus rhythm Borderline T abnormalities, inferior leads No previous ECGs available Confirmed by Frederick Peers [phone]) on 10/19/2018 5:40:00 PM   Radiology Dg Chest Portable 1 View  Result Date: 10/19/2018 CLINICAL DATA:  Cough, sore throat, body aches, headache, chills and weakness for a week. EXAM: PORTABLE CHEST 1 VIEW COMPARISON:  None. FINDINGS: The heart size and mediastinal contours are within normal limits. Both lungs are clear. The visualized skeletal structures are unremarkable. IMPRESSION: No active disease.  No evidence of pneumonia. Electronically Signed   By: Bary Richard M.D.   On: 10/19/2018 17:50    Procedures Procedures (including critical care time)  Medications Ordered in ED Medications - No data to display   Initial Impression / Assessment and Plan / ED Course  I have reviewed the triage vital signs and the nursing notes.  Pertinent labs & imaging results that were available during my care of the patient were reviewed by me and considered in my medical decision making (see chart for details).    23 year old female with history of asthma presents for 1 week of viral illness.  Negative strep test yesterday.  Airway is patent no signs of Ludwig's angina, peritonsillar abscess, retropharyngeal abscess or other deep space infections of the head or neck.  She is with generalized body aches without focal area of tenderness, no signs of injury today.  Endorses mild headache without neuro complaints, reports that headache has resolved from earlier today neuro examination without deficit and is without injury, sudden onset, or other red flags.  She has no signs of meningitis on examination. Endorses  improving shortness of breath over the past few days, lungs clear to auscultation bilaterally.  Chest x-ray is negative.  Patient with some tenderness of the chest wall secondary to coughing, heart regular rate and rhythm without murmur, EKG was performed by nursing staff and is without acute findings reviewed with Dr. Clarene Duke.  Patient is low risk by  Wells criteria and PERC negative.  Suspect patient with viral upper respiratory tract infection causing her cough.  Doubt ACS, dissection, pulmonary embolism or other acute cardiopulmonary etiology of her symptoms today.  She has been using her albuterol inhaler with relief and reports that her shortness of breath has been improving over the past few days.  Patient is requesting refill of albuterol inhaler which has been provided today.  There are no indications for antibiotics at this time.  Patient does report using Tylenol approximately 3 hours prior to arrival, she has remained afebrile in the department.  She is borderline tachycardic with highest recorded pulse at 99 bpm.  She is not tachypneic and has no accessory muscle use.  She is not hypoxic on room air.  Patient appears stable for discharge and PCP follow-up.  No indication for admission at this time.  Patient was evaluated in the context of the global COVID-19 pandemic, which necessitated consideration that the patient might be at risk for infection with the SARS-CoV-2 virus that causes COVID-19. Institutional protocols and algorithms that pertain to the evaluation of patients at risk for COVID-19 are in a state of rapid change based on information released by regulatory bodies including the CDC and federal and state organizations. These policies and algorithms were followed during the patient's care in the ED. Per current guidelines patient does not meet criteria for COVID-19 testing at this time.  At this time there does not appear to be any evidence of an acute emergency medical condition and the  patient appears stable for discharge with appropriate outpatient follow up. Diagnosis was discussed with patient who verbalizes understanding of care plan and is agreeable to discharge. I have discussed return precautions with patient who verbalizes understanding of return precautions. Patient encouraged to follow-up with their PCP. All questions answered. Patient has been discharged in good condition.  Patient's case discussed with Dr. Clarene Duke who agrees with plan to discharge with tessalon and outpatient follow-up.   Note: Portions of this report may have been transcribed using voice recognition software. Every effort was made to ensure accuracy; however, inadvertent computerized transcription errors may still be present.  Final Clinical Impressions(s) / ED Diagnoses   Final diagnoses:  Viral URI with cough    ED Discharge Orders    None       Elizabeth Palau 10/19/18 1839    Little, Ambrose [place], MD 10/19/18 2106

## 2018-10-19 NOTE — ED Notes (Signed)
(  Mom) Jesus Genera [phone]. (Sister) Tory Emerald [phone]

## 2018-10-19 NOTE — ED Notes (Signed)
Xray bedside.

## 2018-10-19 NOTE — ED Notes (Signed)
Bed: WA18 Expected date:  Expected time:  Means of arrival:  Comments: Neg pressure 

## 2018-10-19 NOTE — ED Notes (Addendum)
Pt reports cough, sore throat, body aches, headache, chills, and weakness lasting about a week. Took OTC meds with no relief.

## 2018-10-20 NOTE — ED Notes (Signed)
Pt mother called requesting prescription electronically sent. This Clinical research associate spoke with Army Melia who verbalizes may call in tessalon 100 mg capsule to take every 8 hours, total 20 capsules, no refills; called to [facility] on [address] per mother request. 10/20/18 0935.

## 2018-10-22 LAB — CULTURE, GROUP A STREP (THRC)

## 2018-10-24 ENCOUNTER — Telehealth (HOSPITAL_COMMUNITY): Payer: Self-pay | Admitting: Emergency Medicine

## 2018-10-24 NOTE — Telephone Encounter (Signed)
Culture is positive for non group A Strep germ.  This is a finding of uncertain significance; not the typical 'strep throat' germ. Pt states she got worse and went to the ER and was discharged home to home quarantine. Pt states her sore throat is getting better. No treatment needed.

## 2018-12-26 NOTE — Progress Notes (Signed)
Greater than 5 minutes, yet less than 10 minutes of time have been spent researching, coordinating, and implementing care for this patient today.  Thank you for the details you included in the comment boxes. Those details are very helpful in determining the best course of treatment for you and help us to provide the best care.  

## 2019-05-10 ENCOUNTER — Telehealth: Payer: Managed Care, Other (non HMO) | Admitting: Physician Assistant

## 2019-05-10 DIAGNOSIS — J988 Other specified respiratory disorders: Secondary | ICD-10-CM | POA: Diagnosis not present

## 2019-05-10 MED ORDER — DOXYCYCLINE HYCLATE 100 MG PO TABS: 100.0000 mg | ORAL_TABLET | Freq: Two times a day (BID) | ORAL | 0 refills | Status: DC

## 2019-05-10 MED ORDER — BENZONATATE 100 MG PO CAPS: 100.0000 mg | ORAL_CAPSULE | Freq: Two times a day (BID) | ORAL | 0 refills | Status: DC | PRN

## 2019-05-10 MED ORDER — FLUTICASONE PROPIONATE 50 MCG/ACT NA SUSP: 2.0000 | Freq: Every day | NASAL | 6 refills | Status: DC

## 2019-05-10 NOTE — Progress Notes (Signed)
We are sorry you are not feeling well.  Here is how we plan to help!  Based on what you have shared with me, it looks like you may have a viral upper respiratory infection.  Upper respiratory infections are caused by a large number of viruses; however, rhinovirus is the most common cause. They can also be caused by bacteria.   Given that you have had two weeks of symptoms I would like to start you on an antibiotic as you should have started improve if this was a viral infection. I have prescribed doxycycline 100 mg twice daily for 7 days for you.   Also given COVID 19 pandemic I would like you to follow up for COVID testing at one of the sites listed below. I have attached information for COVID below as well.   Symptoms vary from person to person, with common symptoms including sore throat, cough, fatigue or lack of energy and feeling of general discomfort.  A low-grade fever of up to 100.4 may present, but is often uncommon.  Symptoms vary however, and are closely related to a person's age or underlying illnesses.  The most common symptoms associated with an upper respiratory infection are nasal discharge or congestion, cough, sneezing, headache and pressure in the ears and face.  These symptoms usually persist for about 3 to 10 days, but can last up to 2 weeks.  It is important to know that upper respiratory infections do not cause serious illness or complications in most cases.    Upper respiratory infections can be transmitted from person to person, with the most common method of transmission being a person's hands.  The virus is able to live on the skin and can infect other persons for up to 2 hours after direct contact.  Also, these can be transmitted when someone coughs or sneezes; thus, it is important to cover the mouth to reduce this risk.  To keep the spread of the illness at bay, good hand hygiene is very important.  This is an infection that is most likely caused by a virus. There are no  specific treatments other than to help you with the symptoms until the infection runs its course.  We are sorry you are not feeling well.  Here is how we plan to help!   For nasal congestion, you may use an oral decongestants such as Mucinex D or if you have glaucoma or high blood pressure use plain Mucinex.  Saline nasal spray or nasal drops can help and can safely be used as often as needed for congestion.  For your congestion, I have prescribed Fluticasone nasal spray one spray in each nostril twice a day  If you do not have a history of heart disease, hypertension, diabetes or thyroid disease, prostate/bladder issues or glaucoma, you may also use Sudafed to treat nasal congestion.  It is highly recommended that you consult with a pharmacist or your primary care physician to ensure this medication is safe for you to take.     If you have a cough, you may use cough suppressants such as Delsym and Robitussin.  If you have glaucoma or high blood pressure, you can also use Coricidin HBP.   For cough I have prescribed for you A prescription cough medication called Tessalon Perles 100 mg. You may take 1-2 capsules every 8 hours as needed for cough  If you have a sore or scratchy throat, use a saltwater gargle-  to  teaspoon of salt dissolved in a  4-ounce to 8-ounce glass of warm water.  Gargle the solution for approximately 15-30 seconds and then spit.  It is important not to swallow the solution.  You can also use throat lozenges/cough drops and Chloraseptic spray to help with throat pain or discomfort.  Warm or cold liquids can also be helpful in relieving throat pain.  For headache, pain or general discomfort, you can use Ibuprofen or Tylenol as directed.   Some authorities believe that zinc sprays or the use of Echinacea may shorten the course of your symptoms.   HOME CARE . Only take medications as instructed by your medical team. . Be sure to drink plenty of fluids. Water is fine as well as  fruit juices, sodas and electrolyte beverages. You may want to stay away from caffeine or alcohol. If you are nauseated, try taking small sips of liquids. How do you know if you are getting enough fluid? Your urine should be a pale yellow or almost colorless. . Get rest. . Taking a steamy shower or using a humidifier may help nasal congestion and ease sore throat pain. You can place a towel over your head and breathe in the steam from hot water coming from a faucet. . Using a saline nasal spray works much the same way. . Cough drops, hard candies and sore throat lozenges may ease your cough. . Avoid close contacts especially the very young and the elderly . Cover your mouth if you cough or sneeze . Always remember to wash your hands.   GET HELP RIGHT AWAY IF: . You develop worsening fever. . If your symptoms do not improve within 10 days . You develop yellow or green discharge from your nose over 3 days. . You have coughing fits . You develop a severe head ache or visual changes. . You develop shortness of breath, difficulty breathing or start having chest pain . Your symptoms persist after you have completed your treatment plan  MAKE SURE YOU   Understand these instructions.  Will watch your condition.  Will get help right away if you are not doing well or get worse.  E-Visit for Corona Virus Screening   Your current symptoms could be consistent with the coronavirus.  Many health care providers can now test patients at their office but not all are.  [facility] has multiple testing sites. For information on our COVID testing locations and hours go to [email or web address]  Please quarantine yourself while awaiting your test results.  We are enrolling you in our [facility] for [place] . Daily you will receive a questionnaire within the [facility] website. Our COVID 19 response team willl be monitoriing your responses daily.    COVID-19 is a  respiratory illness with symptoms that are similar to the flu. Symptoms are typically mild to moderate, but there have been cases of severe illness and death due to the virus. The following symptoms may appear 2-14 days after exposure: . Fever . Cough . Shortness of breath or difficulty breathing . Chills . Repeated shaking with chills . Muscle pain . Headache . Sore throat . New loss of taste or smell . Fatigue . Congestion or runny nose . Nausea or vomiting . Diarrhea  It is vitally important that if you feel that you have an infection such as this virus or any other virus that you stay home and away from places where you may spread it to others.  You should self-quarantine for 14 days if you have symptoms that could  potentially be coronavirus or have been in close contact a with a person diagnosed with COVID-19 within the last 2 weeks. You should avoid contact with people age 98 and older.   You should wear a mask or cloth face covering over your nose and mouth if you must be around other people or animals, including pets (even at home). Try to stay at least 6 feet away from other people. This will protect the people around you.  You may also take acetaminophen (Tylenol) as needed for fever.   Reduce your risk of any infection by using the same precautions used for avoiding the common cold or flu:  Marland Kitchen Wash your hands often with soap and warm water for at least 20 seconds.  If soap and water are not readily available, use an alcohol-based hand sanitizer with at least 60% alcohol.  . If coughing or sneezing, cover your mouth and nose by coughing or sneezing into the elbow areas of your shirt or coat, into a tissue or into your sleeve (not your hands). . Avoid shaking hands with others and consider head nods or verbal greetings only. . Avoid touching your eyes, nose, or mouth with unwashed hands.  . Avoid close contact with people who are sick. . Avoid places or events with large numbers  of people in one location, like concerts or sporting events. . Carefully consider travel plans you have or are making. . If you are planning any travel outside or inside the [place], visit the CDC's Travelers' Health webpage for the latest health notices. . If you have some symptoms but not all symptoms, continue to monitor at home and seek medical attention if your symptoms worsen. . If you are having a medical emergency, call 911.  HOME CARE . Only take medications as instructed by your medical team. . Drink plenty of fluids and get plenty of rest. . A steam or ultrasonic humidifier can help if you have congestion.   GET HELP RIGHT AWAY IF YOU HAVE EMERGENCY WARNING SIGNS** FOR COVID-19. If you or someone is showing any of these signs seek emergency medical care immediately. Call 911 or proceed to your closest emergency facility if: . You develop worsening high fever. . Trouble breathing . Bluish lips or face . Persistent pain or pressure in the chest . New confusion . Inability to wake or stay awake . You cough up blood. . Your symptoms become more severe  **This list is not all possible symptoms. Contact your medical provider for any symptoms that are sever or concerning to you.   MAKE SURE YOU   Understand these instructions.  Will watch your condition.  Will get help right away if you are not doing well or get worse.  Your e-visit answers were reviewed by a board certified advanced clinical practitioner to complete your personal care plan.  Depending on the condition, your plan could have included both over the counter or prescription medications.  If there is a problem please reply once you have received a response from your provider.  Your safety is important to [place].  If you have drug allergies check your prescription carefully.    You can use MyChart to ask questions about today's visit, request a non-urgent call back, or ask for a work or school excuse for 24 hours related to  this e-Visit. If it has been greater than 24 hours you will need to follow up with your provider, or enter a new e-Visit to address those concerns. You  will get an e-mail in the next two days asking about your experience.  I hope that your e-visit has been valuable and will speed your recovery. Thank you for using e-visits.    Greater than 5 minutes, yet less than 10 minutes of time have been spent researching, coordinating, and implementing care for this patient today

## 2019-06-25 IMAGING — DX PORTABLE CHEST - 1 VIEW
1 series · 1 of 1 positions shown · non-contrast
Comparison: None.

CLINICAL DATA: Cough, sore throat, body aches, headache, chills and
weakness for a week.

EXAM:
PORTABLE CHEST 1 VIEW

[chest ap]
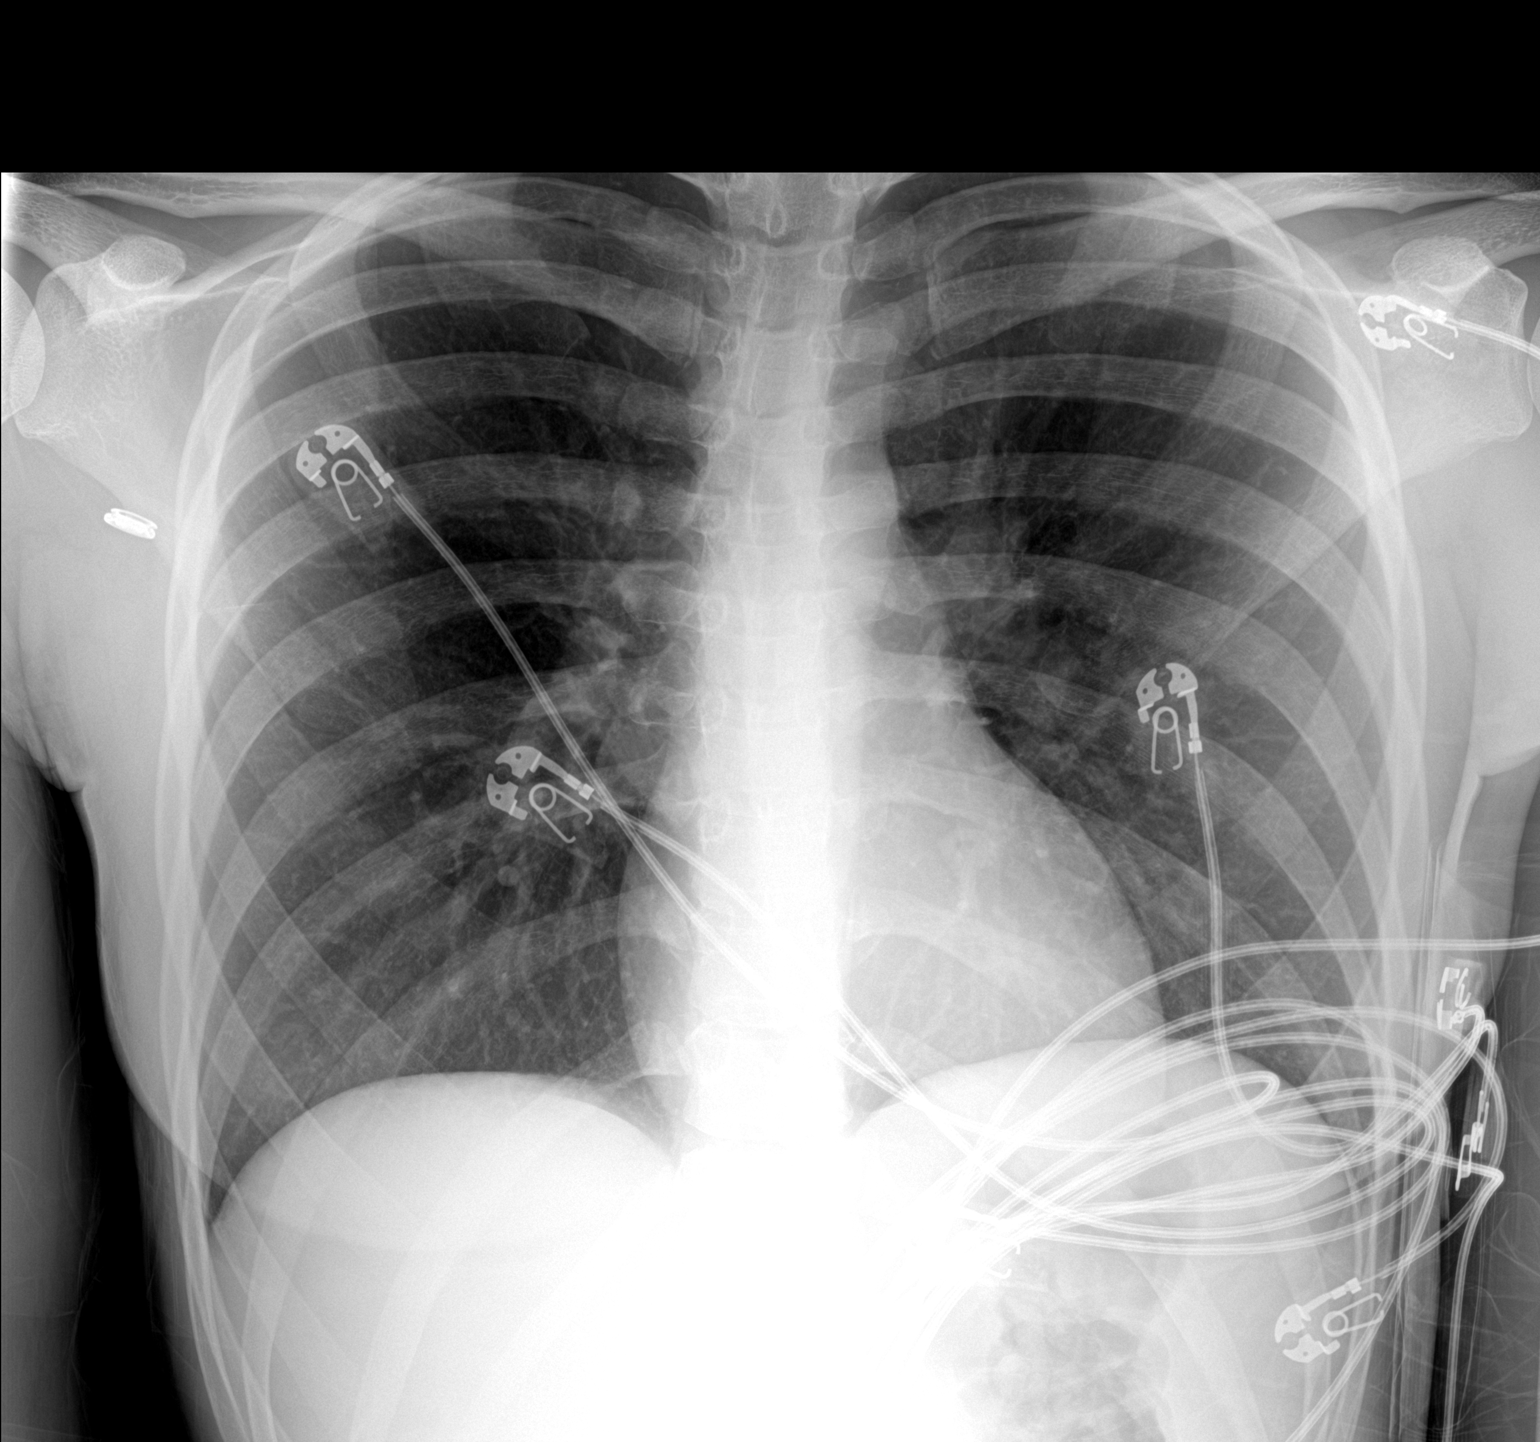

[1 of 1 positions shown; findings below may reference images not displayed]

FINDINGS: The heart size and mediastinal contours are within normal limits.
Both lungs are clear. The visualized skeletal structures are
unremarkable.
IMPRESSION: No active disease.  No evidence of pneumonia.

## 2022-01-14 ENCOUNTER — Emergency Department (HOSPITAL_BASED_OUTPATIENT_CLINIC_OR_DEPARTMENT_OTHER)
Admission: EM | Admit: 2022-01-14 | Discharge: 2022-01-14 | Disposition: A | Discharge status: Home / Self Care | Payer: 59 | Attending: Emergency Medicine | Admitting: Emergency Medicine

## 2022-01-14 ENCOUNTER — Emergency Department (HOSPITAL_BASED_OUTPATIENT_CLINIC_OR_DEPARTMENT_OTHER): Payer: 59

## 2022-01-14 ENCOUNTER — Encounter (HOSPITAL_BASED_OUTPATIENT_CLINIC_OR_DEPARTMENT_OTHER): Payer: Self-pay

## 2022-01-14 ENCOUNTER — Telehealth: Payer: 59 | Admitting: Family Medicine

## 2022-01-14 ENCOUNTER — Other Ambulatory Visit: Payer: Self-pay

## 2022-01-14 DIAGNOSIS — Z975 Presence of (intrauterine) contraceptive device: Secondary | ICD-10-CM | POA: Insufficient documentation

## 2022-01-14 DIAGNOSIS — Z91040 Latex allergy status: Secondary | ICD-10-CM | POA: Diagnosis not present

## 2022-01-14 DIAGNOSIS — N83201 Unspecified ovarian cyst, right side: Secondary | ICD-10-CM | POA: Insufficient documentation

## 2022-01-14 DIAGNOSIS — N83202 Unspecified ovarian cyst, left side: Secondary | ICD-10-CM | POA: Diagnosis not present

## 2022-01-14 DIAGNOSIS — R102 Pelvic and perineal pain: Secondary | ICD-10-CM | POA: Diagnosis present

## 2022-01-14 DIAGNOSIS — R1030 Lower abdominal pain, unspecified: Secondary | ICD-10-CM

## 2022-01-14 DIAGNOSIS — R1032 Left lower quadrant pain: Secondary | ICD-10-CM | POA: Insufficient documentation

## 2022-01-14 LAB — URINALYSIS, ROUTINE W REFLEX MICROSCOPIC
Bilirubin Urine: NEGATIVE
Glucose, UA: NEGATIVE mg/dL
Ketones, ur: NEGATIVE mg/dL
Leukocytes,Ua: NEGATIVE
Nitrite: NEGATIVE
Protein, ur: NEGATIVE mg/dL
Specific Gravity, Urine: 1.021 (ref 1.005–1.030)
pH: 6.5 (ref 5.0–8.0)

## 2022-01-14 LAB — COMPREHENSIVE METABOLIC PANEL
ALT: 19 U/L (ref 0–44)
AST: 16 U/L (ref 15–41)
Albumin: 4.5 g/dL (ref 3.5–5.0)
Alkaline Phosphatase: 51 U/L (ref 38–126)
Anion gap: 7 (ref 5–15)
BUN: 10 mg/dL (ref 6–20)
CO2: 24 mmol/L (ref 22–32)
Calcium: 10 mg/dL (ref 8.9–10.3)
Chloride: 108 mmol/L (ref 98–111)
Creatinine, Ser: 0.74 mg/dL (ref 0.44–1.00)
GFR, Estimated: >60 mL/min (ref >60)
Glucose, Bld: 81 mg/dL (ref 70–99)
Potassium: 3.9 mmol/L (ref 3.5–5.1)
Sodium: 139 mmol/L (ref 135–145)
Total Bilirubin: 0.4 mg/dL (ref 0.3–1.2)
Total Protein: 7.2 g/dL (ref 6.5–8.1)

## 2022-01-14 LAB — PREGNANCY, URINE: Preg Test, Ur: NEGATIVE

## 2022-01-14 LAB — CBC
HCT: 37.5 % (ref 36.0–46.0)
Hemoglobin: 12.5 g/dL (ref 12.0–15.0)
MCH: 28.4 pg (ref 26.0–34.0)
MCHC: 33.3 g/dL (ref 30.0–36.0)
MCV: 85.2 fL (ref 80.0–100.0)
Platelets: 217 10*3/uL (ref 150–400)
RBC: 4.4 MIL/uL (ref 3.87–5.11)
RDW: 12.6 % (ref 11.5–15.5)
WBC: 3.2 10*3/uL — ABNORMAL LOW (ref 4.0–10.5)
nRBC: 0 % (ref 0.0–0.2)

## 2022-01-14 LAB — LIPASE, BLOOD: Lipase: 25 U/L (ref 11–51)

## 2022-01-14 LAB — WET PREP, GENITAL
Sperm: NONE SEEN
Trich, Wet Prep: NONE SEEN
WBC, Wet Prep HPF POC: >=10 — AB (ref <10)
Yeast Wet Prep HPF POC: NONE SEEN

## 2022-01-14 NOTE — Progress Notes (Signed)
Virtual Visit Consent   [name], you are scheduled for a virtual visit with a [facility] provider today. Just as with appointments in the office, your consent must be obtained to participate. Your consent will be active for this visit and any virtual visit you may have with one of our providers in the next 365 days. If you have a MyChart account, a copy of this consent can be sent to you electronically.  As this is a virtual visit, video technology does not allow for your provider to perform a traditional examination. This may limit your provider's ability to fully assess your condition. If your provider identifies any concerns that need to be evaluated in person or the need to arrange testing (such as labs, EKG, etc.), we will make arrangements to do so. Although advances in technology are sophisticated, we cannot ensure that it will always work on either your end or our end. If the connection with a video visit is poor, the visit may have to be switched to a telephone visit. With either a video or telephone visit, we are not always able to ensure that we have a secure connection.  By engaging in this virtual visit, you consent to the provision of healthcare and authorize for your insurance to be billed (if applicable) for the services provided during this visit. Depending on your insurance coverage, you may receive a charge related to this service.  I need to obtain your verbal consent now. Are you willing to proceed with your visit today? Carol Hayes has provided verbal consent on 01/14/2022 for a virtual visit (video or telephone). Georgana Curio, FNP  Date: 01/14/2022 12:01 PM  Virtual Visit via Video Note   I, Georgana Curio, connected with  [name]  ([id number], [birth year]) on 01/14/22 at 11:30 AM EDT by a video-enabled telemedicine application and verified that I am speaking with the correct person using two identifiers.  Location: Patient: Virtual Visit Location Patient:  Home Provider: Virtual Visit Location Provider: Home Office   I discussed the limitations of evaluation and management by telemedicine and the availability of in person appointments. The patient expressed understanding and agreed to proceed.    History of Present Illness: Carol Hayes is a 26 y.o. who identifies as a female who was assigned female at birth, and is being seen today for abdominal pain worsening over the past 2-3 weeks. She reports pain is in LLQ and across lower abdomen also into left lower back. Denies dysuria. Stopped a relationship a few weeks ago. [facility]. Denies discharge but reports the pain is excruciating. No fever. Tampons are painful to insert. She is in her period and has an IUD. She also says she has a history of ovarian cysts and this doesn't feel like one.   HPI: HPI  Problems: There are no problems to display for this patient.   Allergies:  Allergies  Allergen Reactions   Latex     Latex adhesive   Medications:  Current Outpatient Medications:    albuterol (PROVENTIL HFA;VENTOLIN HFA) 108 (90 Base) MCG/ACT inhaler, Inhale into the lungs every 6 (six) hours as needed for wheezing or shortness of breath., Disp: , Rfl:    benzonatate (TESSALON) 100 MG capsule, Take 1 capsule (100 mg total) by mouth every 8 (eight) hours., Disp: 21 capsule, Rfl: 0   benzonatate (TESSALON) 100 MG capsule, Take 1 capsule (100 mg total) by mouth 2 (two) times daily as needed for cough.,  Disp: 20 capsule, Rfl: 0   cetirizine-pseudoephedrine (ZYRTEC-D) 5-120 MG tablet, Take 1 tablet by mouth daily., Disp: 30 tablet, Rfl: 0   doxycycline (VIBRA-TABS) 100 MG tablet, Take 1 tablet (100 mg total) by mouth 2 (two) times daily., Disp: 14 tablet, Rfl: 0   fluticasone (FLONASE) 50 MCG/ACT nasal spray, Place 2 sprays into both nostrils daily., Disp: 16 g, Rfl: 0   fluticasone (FLONASE) 50 MCG/ACT nasal spray, Place 2 sprays into both nostrils  daily., Disp: 16 g, Rfl: 6   lidocaine (XYLOCAINE) 2 % solution, Use as directed 15 mLs in the mouth or throat as needed for mouth pain., Disp: 100 mL, Rfl: 0  Observations/Objective: Patient is well-developed, well-nourished in no acute distress.  In no distress at home. But reports when pain comes it is a "10" Head is normocephalic, atraumatic.  No labored breathing.  Speech is clear and coherent with logical content.  Patient is alert and oriented at baseline.    Assessment and Plan: 1. Lower abdominal pain  Instructed to go to the ED for further eval now. Discussed possible differentials.   Follow Up Instructions: I discussed the assessment and treatment plan with the patient. The patient was provided an opportunity to ask questions and all were answered. The patient agreed with the plan and demonstrated an understanding of the instructions.  A copy of instructions were sent to the patient via MyChart unless otherwise noted below.     The patient was advised to call back or seek an in-person evaluation if the symptoms worsen or if the condition fails to improve as anticipated.  Time:  I spent 10 minutes with the patient via telehealth technology discussing the above problems/concerns.    Georgana Curio, FNP

## 2022-01-14 NOTE — ED Provider Notes (Signed)
[facility] EMERGENCY DEPT Provider Note   CSN: [id number] Arrival date & time: 01/14/22  1236     History  Chief Complaint  Patient presents with   Abdominal Pain    Carol Hayes is a 26 y.o. female.   Abdominal Pain Patient is a 26 year old female with past medical history significant for ovarian cysts presented emergency room today with complaints of abdominal pain and pelvic pain that began 2 weeks ago.  Since that is been mild for the most part but yesterday and today has been severe.  She states it is never completely resolved.  She denies any vaginal discharge and she denies any urinary frequency urgency dysuria or hematuria.     Home Medications Prior to Admission medications   Medication Sig Start Date End Date Taking? Authorizing Provider  albuterol (PROVENTIL HFA;VENTOLIN HFA) 108 (90 Base) MCG/ACT inhaler Inhale into the lungs every 6 (six) hours as needed for wheezing or shortness of breath.    [provider]  benzonatate (TESSALON) 100 MG capsule Take 1 capsule (100 mg total) by mouth every 8 (eight) hours. 10/19/18   Harlene Salts A, PA-C  benzonatate (TESSALON) 100 MG capsule Take 1 capsule (100 mg total) by mouth 2 (two) times daily as needed for cough. 05/10/19   Hedges, Tinnie Gens, PA-C  cetirizine-pseudoephedrine (ZYRTEC-D) 5-120 MG tablet Take 1 tablet by mouth daily. 10/18/18   Wurst, [place], PA-C  doxycycline (VIBRA-TABS) 100 MG tablet Take 1 tablet (100 mg total) by mouth 2 (two) times daily. 05/10/19   Hedges, Tinnie Gens, PA-C  fluticasone (FLONASE) 50 MCG/ACT nasal spray Place 2 sprays into both nostrils daily. 10/18/18   Wurst, [place], PA-C  fluticasone (FLONASE) 50 MCG/ACT nasal spray Place 2 sprays into both nostrils daily. 05/10/19   Hedges, Tinnie Gens, PA-C  lidocaine (XYLOCAINE) 2 % solution Use as directed 15 mLs in the mouth or throat as needed for mouth pain. 10/18/18   Wurst, [place], PA-C      Allergies    Latex    Review  of Systems   Review of Systems  Gastrointestinal:  Positive for abdominal pain.    Physical Exam Updated Vital Signs BP 110/61   Pulse 90   Temp 98.6 F (37 C)   Resp 14   Ht 5\' 10"  (1.778 m)   Wt 62.6 kg   LMP 01/09/2022   SpO2 100%   BMI 19.80 kg/m  Physical Exam Vitals and nursing note reviewed.  Constitutional:      General: She is not in acute distress. HENT:     Head: Normocephalic and atraumatic.     Nose: Nose normal.  Eyes:     General: No scleral icterus. Cardiovascular:     Rate and Rhythm: Normal rate and regular rhythm.     Pulses: Normal pulses.     Heart sounds: Normal heart sounds.  Pulmonary:     Effort: Pulmonary effort is normal. No respiratory distress.     Breath sounds: No wheezing.  Abdominal:     Palpations: Abdomen is soft.     Tenderness: There is no abdominal tenderness.  Genitourinary:    Comments: Pelvic exam without abnormality no cervical motion tenderness There is some left adnexal tenderness Musculoskeletal:     Cervical back: Normal range of motion.     Right lower leg: No edema.     Left lower leg: No edema.  Skin:    General: Skin is warm and dry.     Capillary Refill: Capillary refill takes  less than 2 seconds.  Neurological:     Mental Status: She is alert. Mental status is at baseline.  Psychiatric:        Mood and Affect: Mood normal.        Behavior: Behavior normal.     ED Results / Procedures / Treatments   Labs (all labs ordered are listed, but only abnormal results are displayed) Labs Reviewed  WET PREP, GENITAL - Abnormal; Notable for the following components:      Result Value   Clue Cells Wet Prep HPF POC PRESENT (*)    WBC, Wet Prep HPF POC >=10 (*)    All other components within normal limits  CBC - Abnormal; Notable for the following components:   WBC 3.2 (*)    All other components within normal limits  URINALYSIS, ROUTINE W REFLEX MICROSCOPIC - Abnormal; Notable for the following components:   Hgb  urine dipstick SMALL (*)    All other components within normal limits  LIPASE, BLOOD  COMPREHENSIVE METABOLIC PANEL  PREGNANCY, URINE  GC/CHLAMYDIA PROBE AMP ([place]) NOT AT [facility]    EKG None  Radiology US PELVIC COMPLETE W TRANSVAGINAL AND TORSION R/O  Result Date: 01/14/2022 CLINICAL DATA:  Left lower quadrant pain.  IUD. EXAM: TRANSABDOMINAL AND TRANSVAGINAL ULTRASOUND OF PELVIS DOPPLER ULTRASOUND OF OVARIES TECHNIQUE: Both transabdominal and transvaginal ultrasound examinations of the pelvis were performed. Transabdominal technique was performed for global imaging of the pelvis including uterus, ovaries, adnexal regions, and pelvic cul-de-sac. It was necessary to proceed with endovaginal exam following the transabdominal exam to visualize the ovaries and endometrium. Color and duplex Doppler ultrasound was utilized to evaluate blood flow to the ovaries. COMPARISON:  Pelvic ultrasound 01/30/2016 FINDINGS: Uterus Measurements: 7.2 x 3.1 x 4.4 cm = volume: 51 mL. No fibroids or other mass visualized. Endometrium Thickness: 2 mm. IUD appears low lying in the lower uterine segment. Right ovary Measurements: 2.7 x 1.9 x 2.3 cm = volume: 6.3 mL. 1.6 x 1.5 x 1.6 cm complex cystic structure without vascularity. Left ovary Measurements: 3.2 x 2.6 x 2.4 cm = volume: 10.5 mL. 1.6 x 1.3 x 1.6 cm complex cystic structure without vascularity. Pulsed Doppler evaluation of both ovaries demonstrates normal low-resistance arterial and venous waveforms. Other findings Trace free fluid. IMPRESSION: 1. Small complex cysts or follicles in both ovaries measuring up to 1.6 cm. No follow-up necessary. 2. IUD appears low lying in the lower uterine segment. 3. Trace free fluid in the pelvis. Electronically Signed   By: Darliss Cheney M.D.   On: 01/14/2022 17:21    Procedures Procedures    Medications Ordered in ED Medications - No data to display  ED Course/ Medical Decision Making/ A&P Clinical Course as of  01/14/22 1825  Sun Jan 14, 2022  1733 IMPRESSION: 1. Small complex cysts or follicles in both ovaries measuring up to 1.6 cm. No follow-up necessary. 2. IUD appears low lying in the lower uterine segment. 3. Trace free fluid in the pelvis.   Electronically Signed   By: Darliss Cheney M.D.   On: 01/14/2022 17:21   [WF]    Clinical Course User Index [WF] Gailen Shelter, PA                           Medical Decision Making Amount and/or Complexity of Data Reviewed Labs: ordered. Radiology: ordered.   This patient presents to the ED for concern of pelvic pain, this  involves a number of treatment options, and is a complaint that carries with it a moderate risk of complications and morbidity.  The differential diagnosis includes The causes of generalized abdominal pain include but are not limited to AAA, mesenteric ischemia, appendicitis, diverticulitis, DKA, gastritis, gastroenteritis, AMI, nephrolithiasis, pancreatitis, peritonitis, adrenal insufficiency,lead poisoning, iron toxicity, intestinal ischemia, constipation, UTI,SBO/LBO, splenic rupture, biliary disease, IBD, IBS, PUD, or hepatitis. Ectopic pregnancy, ovarian torsion, PID.    Co morbidities: Discussed in HPI   Brief History:  Patient is a 26 year old female with past medical history significant for ovarian cysts presented emergency room today with complaints of abdominal pain and pelvic pain that began 2 weeks ago.  Since that is been mild for the most part but yesterday and today has been severe.  She states it is never completely resolved.  She denies any vaginal discharge and she denies any urinary frequency urgency dysuria or hematuria.   Pelvic exam unremarkable apart from left adnexal tenderness    EMR reviewed including pt PMHx, past surgical history and past visits to ER.   See HPI for more details   Lab Tests:   I personally reviewed all laboratory work and imaging. Metabolic panel without any acute  abnormality specifically kidney function within normal limits and no significant electrolyte abnormalities. CBC without leukocytosis or significant anemia. Clue cells present on wet prep however she has not experienced any vaginal discharge.  She is asymptomatic from the standpoint we will hold off on treatment with Flagyl  Imaging Studies:  Abnormal findings. I personally reviewed all imaging studies. Imaging notable for ovarian cysts    Cardiac Monitoring:  NA NA   Medicines ordered:  Patient takes 600 mg of ibuprofen just prior to arrival in the ER.  She states she does not need any additional medicines at this time  Critical Interventions:     Consults/Attending Physician      Reevaluation:  After the interventions noted above I re-evaluated patient and found that they have :improved   Social Determinants of Health:      Problem List / ED Course:  Ovarian cyst   Dispostion:  After consideration of the diagnostic results and the patients response to treatment, I feel that the patent would benefit from OB/GYN follow-up   Final Clinical Impression(s) / ED Diagnoses Final diagnoses:  Cysts of both ovaries    Rx / DC Orders ED Discharge Orders     None         Tedd Sias, [place] 01/14/22 2114    Davonna Belling, MD 01/17/22 1423

## 2022-01-14 NOTE — Discharge Instructions (Addendum)
Please follow-up with an OB/GYN  Please use Tylenol or ibuprofen for pain.  You may use 600 mg ibuprofen every 6 hours or 1000 mg of Tylenol every 6 hours.  You may choose to alternate between the 2.  This would be most effective.  Not to exceed 4 g of Tylenol within 24 hours.  Not to exceed 3200 mg ibuprofen 24 hours.

## 2022-01-14 NOTE — ED Triage Notes (Signed)
Patient here POV from Home.  Endorses ABD Pain and Pelvic Pain that began 2 weeks Ago. Pain is Pelvic Region, Mid and LLQ Pain. Dull in but Severe in [place] at Times. Constant since Pain began.  History of Ovarian Cysts. Moderate Nausea. No Emesis. Mild Diarrhea with Menstrual Cycle. Mild Dysuria. No Known Fevers.  NAD Noted during Triage. A&Ox4. GCS 15. Ambulatory.

## 2022-01-15 LAB — GC/CHLAMYDIA PROBE AMP (~~LOC~~) NOT AT ARMC
Chlamydia: NEGATIVE
Comment: NEGATIVE
Comment: NORMAL
Neisseria Gonorrhea: NEGATIVE

## 2022-02-28 ENCOUNTER — Other Ambulatory Visit: Payer: Self-pay

## 2022-02-28 ENCOUNTER — Encounter (HOSPITAL_COMMUNITY): Payer: Self-pay

## 2022-02-28 ENCOUNTER — Ambulatory Visit (HOSPITAL_COMMUNITY)
Admission: RE | Admit: 2022-02-28 | Discharge: 2022-02-28 | Disposition: A | Discharge status: Home / Self Care | Payer: 59 | Source: Ambulatory Visit | Attending: Family Medicine | Admitting: Family Medicine

## 2022-02-28 VITALS — BP 116/77 | HR 87 | Temp 99.4°F | Resp 18

## 2022-02-28 DIAGNOSIS — B349 Viral infection, unspecified: Secondary | ICD-10-CM

## 2022-02-28 LAB — POCT INFECTIOUS MONO SCREEN, ED / UC: Mono Screen: NEGATIVE

## 2022-02-28 MED ORDER — TIZANIDINE HCL 2 MG PO TABS: 2.0000 mg | ORAL_TABLET | Freq: Every day | ORAL | 0 refills | Status: DC

## 2022-02-28 MED ORDER — FLUTICASONE PROPIONATE 50 MCG/ACT NA SUSP: 1.0000 | Freq: Two times a day (BID) | NASAL | 0 refills | Status: DC | PRN

## 2022-02-28 MED ORDER — CETIRIZINE HCL 10 MG PO TABS: 10.0000 mg | ORAL_TABLET | Freq: Every day | ORAL | 0 refills | Status: DC

## 2022-02-28 NOTE — ED Triage Notes (Signed)
PT request a MONO test

## 2022-02-28 NOTE — ED Triage Notes (Signed)
Pt reports having a scratchy throat,Ha, sneezing and neck pain. Pt reports 3 neg COVID tests at home.

## 2022-02-28 NOTE — ED Provider Notes (Signed)
[facility]    CSN: [id number] Arrival date & time: 02/28/22  1416      History   Chief Complaint Chief Complaint  Patient presents with   Sore Throat   Headache   Generalized Body Aches    HPI Carol Hayes is a 26 y.o. female.   HPI Patient presents for evaluation of sore throat, generalized body aches, headache, sneezing, and negative home COVID test is negative x 3 tests.  She reports the most concerning symptoms is overall fatigue, neck pain and generalized body aches.  She reports a sore throat is worse upon awakening however is not severely sore.  She is also concerned as she has tenderness in her cervical lymph nodes which she would like a monotest.  She has never tested positive for mono and does not have a history of strep either.  She has been taken over-the-counter TheraFlu which she reports last night did provide her with some relief and helped her sleep.  She has had no wheezing, chest tightness or shortness of breath. Denies any known fever. Past Medical History:  Diagnosis Date   Asthma     There are no problems to display for this patient.   Past Surgical History:  Procedure Laterality Date   APPENDECTOMY      OB History   No obstetric history on file.      Home Medications    Prior to Admission medications   Medication Sig Start Date End Date Taking? Authorizing Provider  cetirizine (ZYRTEC) 10 MG tablet Take 1 tablet (10 mg total) by mouth at bedtime. 02/28/22  Yes Bing Neighbors, FNP  fluticasone (FLONASE) 50 MCG/ACT nasal spray Place 1 spray into both nostrils 2 (two) times daily as needed for allergies or rhinitis (nasal congestion rhinitis). 02/28/22  Yes Bing Neighbors, FNP  tiZANidine (ZANAFLEX) 2 MG tablet Take 1 tablet (2 mg total) by mouth at bedtime. 02/28/22  Yes Bing Neighbors, FNP  albuterol (PROVENTIL HFA;VENTOLIN HFA) 108 (90 Base) MCG/ACT inhaler Inhale into the lungs every 6 (six) hours as needed for wheezing  or shortness of breath.    [provider]  benzonatate (TESSALON) 100 MG capsule Take 1 capsule (100 mg total) by mouth every 8 (eight) hours. 10/19/18   Harlene Salts A, PA-C  benzonatate (TESSALON) 100 MG capsule Take 1 capsule (100 mg total) by mouth 2 (two) times daily as needed for cough. 05/10/19   Hedges, Tinnie Gens, PA-C  cetirizine-pseudoephedrine (ZYRTEC-D) 5-120 MG tablet Take 1 tablet by mouth daily. 10/18/18   Wurst, [place], PA-C  doxycycline (VIBRA-TABS) 100 MG tablet Take 1 tablet (100 mg total) by mouth 2 (two) times daily. 05/10/19   Hedges, Tinnie Gens, PA-C  lidocaine (XYLOCAINE) 2 % solution Use as directed 15 mLs in the mouth or throat as needed for mouth pain. 10/18/18   Rennis Harding, PA-C    Family History Family History  Problem Relation Age of Onset   Asthma Mother     Social History Social History   Tobacco Use   Smoking status: Never   Smokeless tobacco: Never  Vaping Use   Vaping Use: Never used  Substance Use Topics   Alcohol use: No   Drug use: No     Allergies   Latex   Review of Systems Review of Systems Pertinent negatives listed in HPI  Physical Exam Triage Vital Signs ED Triage Vitals  Enc Vitals Group     BP 02/28/22 1431 116/77  Pulse Rate 02/28/22 1431 87     Resp 02/28/22 1431 18     Temp 02/28/22 1431 99.4 F (37.4 C)     Temp src --      SpO2 02/28/22 1431 98 %     Weight --      Height --      Head Circumference --      Peak Flow --      Pain Score 02/28/22 1428 3     Pain Loc --      Pain Edu? --      Excl. in GC? --    No data found.  Updated Vital Signs BP 116/77   Pulse 87   Temp 99.4 F (37.4 C)   Resp 18   LMP 02/06/2022   SpO2 98%   Visual Acuity Right Eye Distance:   Left Eye Distance:   Bilateral Distance:    Right Eye Near:   Left Eye Near:    Bilateral Near:     Physical Exam Constitutional:      Appearance: She is well-developed.  HENT:     Head: Normocephalic and atraumatic.      Nose: Congestion present.     Mouth/Throat:     Pharynx: Posterior oropharyngeal erythema present. No pharyngeal swelling, oropharyngeal exudate or uvula swelling.     Tonsils: No tonsillar exudate or tonsillar abscesses. 2+ on the right. 2+ on the left.  Eyes:     Conjunctiva/sclera: Conjunctivae normal.     Pupils: Pupils are equal, round, and reactive to light.  Cardiovascular:     Rate and Rhythm: Normal rate and regular rhythm.  Pulmonary:     Effort: Pulmonary effort is normal.     Breath sounds: Normal breath sounds.  Musculoskeletal:     Cervical back: Normal range of motion and neck supple.  Lymphadenopathy:     Cervical: Cervical adenopathy present.  Skin:    Capillary Refill: Capillary refill takes less than 2 seconds.  Neurological:     General: No focal deficit present.     Mental Status: She is alert.  Psychiatric:        Mood and Affect: Mood normal.        Behavior: Behavior normal.      UC Treatments / Results  Labs (all labs ordered are listed, but only abnormal results are displayed) Labs Reviewed  POCT INFECTIOUS MONO SCREEN, ED / UC    EKG   Radiology No results found.  Procedures Procedures (including critical care time)  Medications Ordered in UC Medications - No data to display  Initial Impression / Assessment and Plan / UC Course  I have reviewed the triage vital signs and the nursing notes.  Pertinent labs & imaging results that were available during my care of the patient were reviewed by me and considered in my medical decision making (see chart for details).    Viral illness symptomatic treatment warranted only.  See discharge medication orders and discharge instructions.  Patient advised of follow-up instructions. Final Clinical Impressions(s) / UC Diagnoses   Final diagnoses:  Viral illness     Discharge Instructions      You have a viral illness. Viruses typically last 10-12 days.  Based on when your symptoms started  you are likely nearing the end of your infection.  I prescribed cetirizine and Flonase to help with your nasal symptoms.  Suspect sore throat is related to postnasal drainage.  Tizanidine prescribed for neck and will help  with body aches at that time.  Hydrate well with water. Because you are not febrile you may resume normal activities.  Follow-up as needed.     ED Prescriptions     Medication Sig Dispense Auth. Provider   fluticasone (FLONASE) 50 MCG/ACT nasal spray Place 1 spray into both nostrils 2 (two) times daily as needed for allergies or rhinitis (nasal congestion rhinitis). 16 g Bing Neighbors, FNP   cetirizine (ZYRTEC) 10 MG tablet Take 1 tablet (10 mg total) by mouth at bedtime. 15 tablet Bing Neighbors, FNP   tiZANidine (ZANAFLEX) 2 MG tablet Take 1 tablet (2 mg total) by mouth at bedtime. 12 tablet Bing Neighbors, FNP      PDMP not reviewed this encounter.   Bing Neighbors, FNP 02/28/22 [phone]

## 2022-02-28 NOTE — Discharge Instructions (Addendum)
You have a viral illness. Viruses typically last 10-12 days.  Based on when your symptoms started you are likely nearing the end of your infection.  I prescribed cetirizine and Flonase to help with your nasal symptoms.  Suspect sore throat is related to postnasal drainage.  Tizanidine prescribed for neck and will help with body aches at that time.  Hydrate well with water. Because you are not febrile you may resume normal activities.  Follow-up as needed.

## 2022-06-04 ENCOUNTER — Ambulatory Visit (INDEPENDENT_AMBULATORY_CARE_PROVIDER_SITE_OTHER): Payer: 59 | Admitting: Obstetrics & Gynecology

## 2022-06-04 ENCOUNTER — Other Ambulatory Visit (HOSPITAL_COMMUNITY)
Admission: RE | Admit: 2022-06-04 | Discharge: 2022-06-04 | Disposition: A | Discharge status: Home / Self Care | Payer: 59 | Source: Ambulatory Visit | Attending: Obstetrics & Gynecology | Admitting: Obstetrics & Gynecology

## 2022-06-04 ENCOUNTER — Encounter: Payer: Self-pay | Admitting: Obstetrics & Gynecology

## 2022-06-04 VITALS — BP 122/86 | HR 83 | Wt 150.0 lb

## 2022-06-04 DIAGNOSIS — G8929 Other chronic pain: Secondary | ICD-10-CM | POA: Diagnosis not present

## 2022-06-04 DIAGNOSIS — Z124 Encounter for screening for malignant neoplasm of cervix: Secondary | ICD-10-CM | POA: Diagnosis not present

## 2022-06-04 DIAGNOSIS — Z113 Encounter for screening for infections with a predominantly sexual mode of transmission: Secondary | ICD-10-CM | POA: Insufficient documentation

## 2022-06-04 DIAGNOSIS — Z23 Encounter for immunization: Secondary | ICD-10-CM | POA: Diagnosis not present

## 2022-06-04 DIAGNOSIS — Z30432 Encounter for removal of intrauterine contraceptive device: Secondary | ICD-10-CM

## 2022-06-04 DIAGNOSIS — Z975 Presence of (intrauterine) contraceptive device: Secondary | ICD-10-CM | POA: Insufficient documentation

## 2022-06-04 DIAGNOSIS — R102 Pelvic and perineal pain: Secondary | ICD-10-CM | POA: Diagnosis not present

## 2022-06-04 NOTE — Progress Notes (Signed)
GYNECOLOGY OFFICE VISIT NOTE  History:   Carol Hayes is a 26 y.o. G0P0000 here today for follow up from ED visit after evaluation for pelvic pain. Reported having occasional episode of pelvic pain during intercourse and other times, not around periods.  Feels it may have to do with her Paragard that has been in place since 2021, wants this removed.  Also wants pap smear and STI screening, reports having abnormal pap smear in 2021.  Not sexually active right now, does not want anything else for birth control. She denies any current abnormal vaginal discharge, bleeding, pelvic pain or other concerns.    Past Medical History:  Diagnosis Date   Asthma     Past Surgical History:  Procedure Laterality Date   APPENDECTOMY      The following portions of the patient's history were reviewed and updated as appropriate: allergies, current medications, past family history, past medical history, past social history, past surgical history and problem list.   Review of Systems:  Pertinent items noted in HPI and remainder of comprehensive ROS otherwise negative.  Physical Exam:  BP 122/86   Pulse 83   Wt 150 lb (68 kg)   BMI 21.52 kg/m  CONSTITUTIONAL: Well-developed, well-nourished female in no acute distress.  HEENT:  Normocephalic, atraumatic. External right and left ear normal. No scleral icterus.  NECK: Normal range of motion, supple, no masses noted on observation SKIN: No rash noted. Not diaphoretic. No erythema. No pallor. MUSCULOSKELETAL: Normal range of motion. No edema noted. NEUROLOGIC: Alert and oriented to person, place, and time. Normal muscle tone coordination. No cranial nerve deficit noted. PSYCHIATRIC: Normal mood and affect. Normal behavior. Normal judgment and thought content. CARDIOVASCULAR: Normal heart rate noted RESPIRATORY: Effort and breath sounds normal, no problems with respiration noted ABDOMEN: No masses noted. No other overt distention noted.   PELVIC:  Normal appearing external genitalia; normal urethral meatus; normal appearing vaginal mucosa and cervix.  No abnormal discharge noted. Paragard strings seen.  Normal uterine size, no other palpable masses, no uterine or adnexal tenderness. Performed in the presence of a chaperone  IUD Removal  Patient identified, informed consent performed, consent signed.  Patient was placed in the dorsal lithotomy position, normal external genitalia was noted.  A speculum was placed in the patient's vagina, normal discharge was noted, no lesions. The cervix was visualized, no lesions, no abnormal discharge.  The strings of the IUD were grasped and pulled using ring forceps. The IUD was removed in its entirety.    Assessment and Plan:     1. Needs flu shot - Flu Vaccine QUAD 64mo+IM (Fluarix, Fluzone & Alfiuria Quad PF)  2. Routine screening for STI (sexually transmitted infection) - Cytology - PAP - RPR+HBsAg+HCVAb+...  3. Pap smear for cervical cancer screening - Cytology - PAP done, will follow up results and manage accordingly.  4. Encounter for IUD removal Successful IUD removal. No contraception for now.  5. Chronic female pelvic pain No current symptoms.  If continues after IUD removal, may consider hormonal therapy trial for possible endometriosis.    Routine preventative health maintenance measures emphasized. Please refer to After Visit Summary for other counseling recommendations.   Return for any gynecologic concerns.    I spent 30 minutes dedicated to the care of this patient including pre-visit review of records, face to face time with the patient discussing her conditions and treatments and post visit orders.    Jaynie Collins, MD, FACOG Obstetrician & Gynecologist, Faculty Practice  Center for [employer], [facility]

## 2022-06-05 LAB — RPR+HBSAG+HCVAB+...
HIV Screen 4th Generation wRfx: NONREACTIVE
Hep C Virus Ab: NONREACTIVE
Hepatitis B Surface Ag: NEGATIVE
RPR Ser Ql: NONREACTIVE

## 2022-06-07 LAB — CYTOLOGY - PAP
Chlamydia: NEGATIVE
Comment: NEGATIVE
Comment: NEGATIVE
Comment: NEGATIVE
Comment: NEGATIVE
Comment: NORMAL
Diagnosis: NEGATIVE
Diagnosis: REACTIVE
HSV1: NEGATIVE
HSV2: NEGATIVE
High risk HPV: NEGATIVE
Neisseria Gonorrhea: NEGATIVE
Trichomonas: NEGATIVE

## 2022-06-18 ENCOUNTER — Encounter: Payer: Self-pay | Admitting: *Deleted

## 2022-09-06 ENCOUNTER — Encounter (HOSPITAL_BASED_OUTPATIENT_CLINIC_OR_DEPARTMENT_OTHER): Payer: Self-pay

## 2022-09-06 ENCOUNTER — Ambulatory Visit: Payer: 59

## 2022-09-06 ENCOUNTER — Emergency Department (HOSPITAL_BASED_OUTPATIENT_CLINIC_OR_DEPARTMENT_OTHER): Payer: 59 | Admitting: Radiology

## 2022-09-06 ENCOUNTER — Other Ambulatory Visit: Payer: Self-pay

## 2022-09-06 ENCOUNTER — Emergency Department (HOSPITAL_BASED_OUTPATIENT_CLINIC_OR_DEPARTMENT_OTHER)
Admission: EM | Admit: 2022-09-06 | Discharge: 2022-09-06 | Disposition: A | Discharge status: Home / Self Care | Payer: 59 | Attending: Emergency Medicine | Admitting: Emergency Medicine

## 2022-09-06 DIAGNOSIS — R0789 Other chest pain: Secondary | ICD-10-CM | POA: Insufficient documentation

## 2022-09-06 DIAGNOSIS — Z91040 Latex allergy status: Secondary | ICD-10-CM | POA: Insufficient documentation

## 2022-09-06 DIAGNOSIS — J45909 Unspecified asthma, uncomplicated: Secondary | ICD-10-CM | POA: Diagnosis not present

## 2022-09-06 DIAGNOSIS — R079 Chest pain, unspecified: Secondary | ICD-10-CM

## 2022-09-06 LAB — CBC
HCT: 37 % (ref 36.0–46.0)
Hemoglobin: 12.5 g/dL (ref 12.0–15.0)
MCH: 28.6 pg (ref 26.0–34.0)
MCHC: 33.8 g/dL (ref 30.0–36.0)
MCV: 84.7 fL (ref 80.0–100.0)
Platelets: 196 10*3/uL (ref 150–400)
RBC: 4.37 MIL/uL (ref 3.87–5.11)
RDW: 13.2 % (ref 11.5–15.5)
WBC: 3.3 10*3/uL — ABNORMAL LOW (ref 4.0–10.5)
nRBC: 0 % (ref 0.0–0.2)

## 2022-09-06 LAB — BASIC METABOLIC PANEL
Anion gap: 6 (ref 5–15)
BUN: 12 mg/dL (ref 6–20)
CO2: 24 mmol/L (ref 22–32)
Calcium: 9.5 mg/dL (ref 8.9–10.3)
Chloride: 107 mmol/L (ref 98–111)
Creatinine, Ser: 0.89 mg/dL (ref 0.44–1.00)
GFR, Estimated: >60 mL/min (ref >60)
Glucose, Bld: 67 mg/dL — ABNORMAL LOW (ref 70–99)
Potassium: 5 mmol/L (ref 3.5–5.1)
Sodium: 137 mmol/L (ref 135–145)

## 2022-09-06 LAB — TROPONIN I (HIGH SENSITIVITY): Troponin I (High Sensitivity): <2 ng/L (ref <18)

## 2022-09-06 LAB — D-DIMER, QUANTITATIVE: D-Dimer, Quant: 0.4 ug/mL-FEU (ref 0.00–0.50)

## 2022-09-06 LAB — PREGNANCY, URINE: Preg Test, Ur: NEGATIVE

## 2022-09-06 MED ORDER — ALUM & MAG HYDROXIDE-SIMETH 200-200-20 MG/5ML PO SUSP
30.0000 mL | Freq: Once | ORAL | Status: AC
  Administered 2022-09-06: 30 mL via ORAL
  Filled 2022-09-06: qty 30

## 2022-09-06 NOTE — ED Notes (Signed)
Patient verbalizes understanding of discharge instructions. Opportunity for questioning and answers were provided. Patient discharged from ED.  °

## 2022-09-06 NOTE — ED Provider Notes (Addendum)
[facility] Provider Note   CSN: [id number] Arrival date & time: 09/06/22  1145     History  Chief Complaint  Patient presents with   Chest Pain    Carol Hayes is a 27 y.o. female with Hx of asthma presenting to the ED with chief complaint of chest pain/discomfort over the last 2-2 and half weeks.  Described as occasionally radiating to the right shoulder blade and intermittent.  Usually occurs at random, or with exertion such as walking long distances.  Also feels as if her heart rate has been beating unusually fast.  Has tried her albuterol occasionally, without much relief.  Last taken yesterday morning.  Denies URI symptoms, fevers, chills, neck stiffness, N/V/D, abdominal pain.  States she believes at 1 point it may be related to when she eats, however is unsure.  Patient states she went to UC earlier this morning and was recommended to come to the ED for further evaluation, was told she could not be further evaluated there.  Patient states she is unsure why.  No known cardiac history.  The history is provided by the patient and medical records.  Chest Pain     Home Medications Prior to Admission medications   Medication Sig Start Date End Date Taking? Authorizing Provider  albuterol (PROVENTIL HFA;VENTOLIN HFA) 108 (90 Base) MCG/ACT inhaler Inhale into the lungs every 6 (six) hours as needed for wheezing or shortness of breath.    [provider]      Allergies    Latex    Review of Systems   Review of Systems  Cardiovascular:  Positive for chest pain.    Physical Exam Updated Vital Signs BP 126/80   Pulse 81   Temp 99 F (37.2 C)   Resp 18   Ht '5\' 10"'$  (1.778 m)   Wt 69.4 kg   LMP 08/23/2022   SpO2 100%   BMI 21.95 kg/m  Physical Exam Vitals and nursing note reviewed.  Constitutional:      General: She is not in acute distress.    Appearance: She is well-developed. She is not ill-appearing,  toxic-appearing or diaphoretic.  HENT:     Head: Normocephalic and atraumatic.  Eyes:     Conjunctiva/sclera: Conjunctivae normal.  Neck:     Thyroid: No thyromegaly.     Vascular: No JVD.  Cardiovascular:     Rate and Rhythm: Normal rate and regular rhythm.     Chest Wall: PMI is not displaced.     Pulses:          Radial pulses are 2+ on the right side and 2+ on the left side.       Posterior tibial pulses are 2+ on the right side and 2+ on the left side.     Heart sounds: Normal heart sounds. No murmur heard. Pulmonary:     Effort: Pulmonary effort is normal. No accessory muscle usage or respiratory distress.     Breath sounds: Normal breath sounds.     Comments: Communicates well without difficulty, without increased respiratory effort, CTAB.  Equal chest rise.  Adequate air movement.  No tripoding or drooling. Chest:     Chest wall: No mass, deformity, tenderness, crepitus or edema.  Abdominal:     Palpations: Abdomen is soft. There is no mass.     Tenderness: There is no abdominal tenderness. There is no guarding.  Musculoskeletal:        General: No swelling.  Cervical back: Neck supple.     Right lower leg: No tenderness. No edema.     Left lower leg: No tenderness. No edema.  Lymphadenopathy:     Cervical: No cervical adenopathy.  Skin:    General: Skin is warm and dry.     Capillary Refill: Capillary refill takes less than 2 seconds.     Coloration: Skin is not cyanotic or pale.     Findings: No ecchymosis or erythema.  Neurological:     Mental Status: She is alert.  Psychiatric:        Mood and Affect: Mood is anxious.     ED Results / Procedures / Treatments   Labs (all labs ordered are listed, but only abnormal results are displayed) Labs Reviewed  BASIC METABOLIC PANEL - Abnormal; Notable for the following components:      Result Value   Glucose, Bld 67 (*)    All other components within normal limits  CBC - Abnormal; Notable for the following  components:   WBC 3.3 (*)    All other components within normal limits  PREGNANCY, URINE  D-DIMER, QUANTITATIVE  TROPONIN I (HIGH SENSITIVITY)  TROPONIN I (HIGH SENSITIVITY)    EKG EKG Interpretation  Date/Time:  Thursday September 06 2022 11:54:10 EST Ventricular Rate:  88 PR Interval:  132 QRS Duration: 77 QT Interval:  339 QTC Calculation: 411 R Axis:   75 Text Interpretation: Sinus rhythm Borderline T abnormalities, inferior leads Confirmed by Garnette Gunner [phone]) on 09/06/2022 12:00:32 PM  Radiology DG Chest 2 View  Result Date: 09/06/2022 CLINICAL DATA:  cp chest x-ray EXAM: CHEST - 2 VIEW COMPARISON:  October 19, 2018. FINDINGS: The heart size and mediastinal contours are within normal limits. Both lungs are clear. No visible pleural effusions or pneumothorax. No acute osseous abnormality. IMPRESSION: No active cardiopulmonary disease. Electronically Signed   By: Margaretha Sheffield M.D.   On: 09/06/2022 12:12    Procedures Procedures    Medications Ordered in ED Medications  alum & mag hydroxide-simeth (MAALOX/MYLANTA) 200-200-20 MG/5ML suspension 30 mL (30 mLs Oral Given 09/06/22 1413)    ED Course/ Medical Decision Making/ A&P Clinical Course as of 09/06/22 1932  Thu Sep 06, 2022  1318 D-Dimer, Quant: 0.40 Negative [AC]    Clinical Course User Index [AC] Prince Rome, PA-C             HEART Score: 0                Medical Decision Making Amount and/or Complexity of Data Reviewed Labs: ordered. Decision-making details documented in ED Course. Radiology: ordered.  Risk OTC drugs.   27 y.o. female presents to the ED for concern of Chest Pain   This involves an extensive number of treatment options, and is a complaint that carries with it a high risk of complications and morbidity.  The emergent differential diagnosis prior to evaluation includes, but is not limited to: ACS, pneumonia, pneumothorax, pulmonary embolism, pericarditis/myocarditis,  GERD, PUD, musculoskeletal, costochondritis, anxiousness  This is not an exhaustive differential.   Past Medical History / Co-morbidities / Social History: Hx of asthma Social Determinants of Health include: No PCP, resources provided  Additional History:  None  Lab Tests: I ordered, and personally interpreted labs.  The pertinent results include:   WBC 3.3, no anemia Urine pregnancy negative Troponin <2 D-dimer 0.40 BMP unremarkable  Imaging Studies: I ordered imaging studies including CXR.   I independently visualized and interpreted imaging which showed  no acute cardiopulmonary pathology.  I agree with the radiologist interpretation. I personally ordered and interpreted EKG 12 lead findings, which showed: Normal sinus rhythm and sinus tachycardia, average BPM ranging between 95 to 125 bpm  ED Course: Pt overall well though mildly anxious appearing on exam.  Presenting to the ED with 2 to 2.5 weeks of chest pain/discomfort.  Occasionally radiates to right shoulder.  Worse at random or with exertion per patient.  Patient reports first going to the UC this morning, and was recommended to come to the ED for further evaluation, patient unable to elaborate further. HEART score of 1, low risk.  Considered ASA and nitroglycerin, however do not seem appropriate at this time.  PERC positive due to consistent tachycardia, HR varies between 98 to 120 bpm on exam.  Mild waxing and waning tachypnea and tachycardia.  Heart rate improves with slowed breaths, appears to increase when appearing clinically anxious.  No clinical evidence of DVT.  Patient satting around 98-100% on room air.  D-dimer 0.40, negative.  Doubt PE.  Labs unremarkable.  Unlikely pneumonia, no cough, no leukocytosis, no fevers, CXR and exam without acute findings.  Unlikely pneumothorax, no findings on CXR.  Unlikely pericarditis/myocarditis, as does not fit clinical picture.  Chest pain not exertional.  No evidence of pleural  effusion or pulmonary edema on CXR.  Unlikely dissection, no pulse deficit, no tearing chest pain, no neurologic complaints.  EKG findings indicate without evidence of acute ischemic changes, abnormal intervals, or dysrhythmia. Troponin <2.  Doubt ACS or costochondritis.  No anemia. Mild improvement following GI cocktail.  Overall, I am uncertain the exact etiology of the patient's symptoms.  However, I do not believe she is currently experiencing a medical, surgical, or psychiatric emergency.  May be suggestive of PUD, GERD, or increased anxiousness.  Recommend trial of Pepcid/Tums as needed, with close PCP follow-up, resources provided.  Pt reports satisfaction with today's encounter.  Patient in NAD and in good condition at time of discharge.  Disposition: After consideration the patient's encounter today, I do not feel today's workup suggests an emergent condition requiring admission or immediate intervention beyond what has been performed at this time.  Safe for discharge; instructed to return immediately for worsening symptoms, change in symptoms or any other concerns.  I have reviewed the patients home medicines and have made adjustments as needed.  Discussed course of treatment with the patient, whom demonstrated understanding.  Patient in agreement and has no further questions.     This chart was dictated using voice recognition software.  Despite best efforts to proofread, errors can occur which can change the documentation meaning.         Final Clinical Impression(s) / ED Diagnoses Final diagnoses:  Nonspecific chest pain    Rx / DC Orders ED Discharge Orders     None         Prince Rome, PA-C [id number]    Prince Rome, PA-C [id number]    Cristie Hem, MD 09/07/22 [phone]

## 2022-09-06 NOTE — ED Triage Notes (Signed)
Pt c/o intermittent CP x2-2.5wks, described as pressure/ dull pain "kinda like I'm breathing through a straw." Denies known sick contact, cardiac hx

## 2022-09-06 NOTE — Discharge Instructions (Addendum)
Your caregiver has diagnosed you as having chest pain that is nonspecific for one problem. This means that after looking at you and examining you and ordering tests (such as blood work, chest x-rays and EKG), your caregiver does not believe that the problem is serious enough to need watching in the hospital. This judgment is often made after testing shows no acute heart attack and you are at low risk for sudden acute heart condition. Chest pain comes from many different causes.  Seek immediate medical attention if:  You have severe chest pain, especially if the pain is crushing or pressure-like and spreads to the arms, back, neck, or jaw, or if you have sweating, nausea, shortness of breath. This is an emergency. Don't wait to see if the pain will go away. Get medical help at once. Call 911 immediately. Do not drive herself to the hospital.  Your chest pain gets worse and does not go away with rest.  You have an attack of chest pain lasting longer than usual, despite rest and treatment with the medications your caregiver has prescribed  You awaken from sleep with chest pain or shortness of breath.  You feel faint or dizzy  You have chest pain not typical of your usual pain for which you originally saw your caregiver.  You must have a repeat evaluation within 2-3 days for a recheck of your heart.  Please call your doctor today to schedule this appointment.   Your symptoms may or may not be related to acid reflux, in which you may try antacids such as Tums or Pepcid for symptom relief.  If you do not have a family doctor, resources for 2 local PCP offices have been provided for you, please call to schedule your follow-up appointment as discussed.  RESOURCE GUIDE  Dental Problems  Patients with Medicaid: [facility] Phone:  [phone]                                                   Phone:  [phone]  If unable to pay or uninsured, contact:  [facility] or [facility]. to become qualified for the adult dental clinic.  Chronic Pain Problems Contact [facility]  [phone] Patients need to be referred by their primary care doctor.  Insufficient Money for Medicine Contact [employer]:  call "211" or [place] [phone].  No Primary Care Doctor Call [facility]  [phone] Other agencies that provide inexpensive medical care    [place]  [id number]    [facility]  [place]  [phone]    [facility]  [phone]    [facility]  [place]  [place]  [place]   [phone] (emergency services [phone])  Substance Abuse Resources [facility]  [phone] [facility] [phone] [facility] [place] [phone] [facility] [phone] Residential & Outpatient Substance Abuse Program  [phone]  Abuse/Neglect [place] [phone] [place] [phone] (After Hours)  Emergency [place] [phone]  [place] [facility] [place] [phone] [place] [phone]  MRSA Hotline #:   (332)707-6308    [facility] Dept. [address]      [place] Phone:  Q9440039                                   Phone:  [phone]                 Phone:  [place] Phone:  [place] [phone] (After Hours)

## 2022-09-11 ENCOUNTER — Encounter: Payer: Self-pay | Admitting: Obstetrics & Gynecology

## 2022-11-16 ENCOUNTER — Encounter (INDEPENDENT_AMBULATORY_CARE_PROVIDER_SITE_OTHER): Payer: Self-pay | Admitting: Primary Care

## 2022-11-16 ENCOUNTER — Ambulatory Visit (INDEPENDENT_AMBULATORY_CARE_PROVIDER_SITE_OTHER): Payer: 59 | Admitting: Primary Care

## 2022-11-16 VITALS — BP 98/68 | HR 83 | Temp 97.9°F | Resp 16 | Ht 71.0 in

## 2022-11-16 DIAGNOSIS — Z6820 Body mass index (BMI) 20.0-20.9, adult: Secondary | ICD-10-CM | POA: Diagnosis not present

## 2022-11-16 DIAGNOSIS — M2550 Pain in unspecified joint: Secondary | ICD-10-CM

## 2022-11-16 DIAGNOSIS — Z7689 Persons encountering health services in other specified circumstances: Secondary | ICD-10-CM

## 2022-11-16 DIAGNOSIS — F32A Depression, unspecified: Secondary | ICD-10-CM

## 2022-11-16 DIAGNOSIS — Z833 Family history of diabetes mellitus: Secondary | ICD-10-CM

## 2022-11-16 DIAGNOSIS — L299 Pruritus, unspecified: Secondary | ICD-10-CM | POA: Diagnosis not present

## 2022-11-16 MED ORDER — ESCITALOPRAM OXALATE 10 MG PO TABS: 10.0000 mg | ORAL_TABLET | Freq: Every day | ORAL | 1 refills | Status: DC

## 2022-11-16 MED ORDER — HYDROXYZINE HCL 10 MG PO TABS: 10.0000 mg | ORAL_TABLET | Freq: Three times a day (TID) | ORAL | 0 refills | Status: DC | PRN

## 2022-11-16 NOTE — Progress Notes (Signed)
Request labs  - has hives daily, was seen by Rheumatology for an autoimmune response  -inflammation, tingling, and swelling in legs - feels like legs fall asleep with just sitting

## 2022-11-16 NOTE — Progress Notes (Signed)
New Patient Office Visit  Subjective    Patient ID: Carol Hayes, female    DOB: 1996/04/30  Age: 27 y.o. MRN: [id number]  CC:  Chief Complaint  Patient presents with   New Patient (Initial Visit)    HPI Ms. Carol Hayes is 27 year old presents to establish care.Body mass index is 21.34 kg/m.  She is concern with hives daily, was seen by Rheumatology for an autoimmune response inflammation, tingling, and swelling in legs and legs fall asleep with just sitting- happens with in minutes and takes days for sensation to return. Patient has No headache, No chest pain, No abdominal pain - No Nausea, No new weakness tingling or numbness, No Cough - shortness of breath. She is also, has depression previously on Lutuda and did not like the feeling. Requesting tx she is followed by psychologist - Carol Hayes.  Outpatient Encounter Medications as of 11/16/2022  Medication Sig   albuterol (PROVENTIL HFA;VENTOLIN HFA) 108 (90 Base) MCG/ACT inhaler Inhale into the lungs every 6 (six) hours as needed for wheezing or shortness of breath.   No facility-administered encounter medications on file as of 11/16/2022.    Past Medical History:  Diagnosis Date   Anxiety    Asthma    Depression     Past Surgical History:  Procedure Laterality Date   APPENDECTOMY      Family History  Problem Relation Age of Onset   Hypertension Mother    Anxiety disorder Mother    Asthma Mother    Kidney disease Father    Heart disease Father    Diabetes Father     Social History   Socioeconomic History   Marital status: Single    Spouse name: Not on file   Number of children: Not on file   Years of education: Not on file   Highest education level: Not on file  Occupational History   Not on file  Tobacco Use   Smoking status: Never   Smokeless tobacco: Never  Vaping Use   Vaping Use: Never used  Substance and Sexual Activity   Alcohol use: No   Drug use: No   Sexual activity: Not  Currently    Birth control/protection: I.U.D.    Comment: Copper IUD placed in 2021  Other Topics Concern   Not on file  Social History Narrative   Not on file   Social Determinants of Health   Financial Resource Strain: Not on file  Food Insecurity: Not on file  Transportation Needs: Not on file  Physical Activity: Not on file  Stress: Not on file  Social Connections: Not on file  Intimate Partner Violence: Not on file    ROS Comprehensive ROS Pertinent positive and negative noted in HPI     Objective    Blood Pressure 98/68 (BP Location: Left Arm, Patient Position: Sitting, Cuff Size: Normal)   Pulse 83   Temperature 97.9 F (36.6 C) (Oral)   Respiration 16   Height 5\' 11"  (1.803 m)   Last Menstrual Period 11/05/2022 (Approximate)   Oxygen Saturation 99%   Body Mass Index 21.34 kg/m    General: No apparent distress. Eyes: Extraocular eye movements intact, pupils equal and round. Neck: Supple, trachea midline. Thyroid: No enlargement, mobile without fixation, no tenderness. Cardiovascular: Regular rhythm and rate, no murmur, normal radial pulses. Respiratory: Normal respiratory effort, clear to auscultation. Gastrointestinal: Normal pitch active bowel sounds, nontender abdomen without distention or appreciable hepatomegaly. Musculoskeletal: Normal muscle tone, no tenderness on palpation  of tibia, no excessive thoracic kyphosis. Skin: Appropriate warmth, no visible rash. Mental status: Alert, conversant, speech clear, thought logical, appropriate mood and affect, no hallucinations or delusions evident. Hematologic/lymphatic: No cervical adenopathy, no visible ecchymoses.  Assessment & Plan:  Carol Hayes was seen today for new patient (initial visit).  Diagnoses and all orders for this visit:  Polyarthralgia -     Rheumatoid Arthritis Profile -     Sedimentation Rate  Encounter to establish care  Pruritus -     hydrOXYzine (ATARAX) 10 MG tablet; Take 1 tablet  (10 mg total) by mouth every 8 (eight) hours as needed for itching or anxiety. -     Comprehensive metabolic panel  BMI 20.0-20.9, adult Normal wnl  Depression, unspecified depression type See HPI -     CBC with Differential/Platelet  Family history of diabetes mellitus in father -     Hemoglobin A1c; Future  Other orders -     escitalopram (LEXAPRO) 10 MG tablet; Take 1 tablet (10 mg total) by mouth daily.      Grayce Sessions, NP

## 2022-11-16 NOTE — Patient Instructions (Addendum)
What Is Atarax. Atarax (hydroxyzine hydrochloride) is an antihistamine with anticholinergic (drying) and sedative properties used for symptomatic relief of anxiety and tension associated with psychoneurosis and as an adjunct in organic disease states in which anxiety is manifested.Escitalopram Solution What is this medication? ESCITALOPRAM (es sye TAL oh pram) treats depression and anxiety. It increases the amount of serotonin in the brain, a hormone that helps regulate mood. It belongs to a group of medications called SSRIs. This medicine may be used for other purposes; ask your health care provider or pharmacist if you have questions. COMMON BRAND NAME(S): Lexapro What should I tell my care team before I take this medication? They need to know if you have any of these conditions: Bipolar disorder or a family history of bipolar disorder Diabetes Glaucoma Heart disease Kidney or liver disease Receiving electroconvulsive therapy Seizures Suicidal thoughts, plans, or attempt by you or a family member An unusual or allergic reaction to escitalopram, citalopram, other medications, foods, dyes, or preservatives Pregnant or trying to become pregnant Breast-feeding How should I use this medication? Take this medication by mouth. Follow the directions on the prescription label. Use a specially marked spoon or container to measure your medication. Ask your pharmacist if you do not have one. Household spoons are not accurate. This medication can be taken with or without food. Take your medication at regular intervals. Do not take it more often than directed. Do not stop taking this medication suddenly except upon the advice of your care team. Stopping this medication too quickly may cause serious side effects or your condition may worsen. A special MedGuide will be given to you by the pharmacist with each prescription and refill. Be sure to read this information carefully each time. Talk to your care team  regarding the use of this medication in children. Special care may be needed. Overdosage: If you think you have taken too much of this medicine contact a poison control center or emergency room at once. NOTE: This medicine is only for you. Do not share this medicine with others. What if I miss a dose? If you miss a dose, take it as soon as you can. If it is almost time for your next dose, take only that dose. Do not take double or extra doses. What may interact with this medication? Do not take this medication with any of the following: Certain medications for fungal infections like fluconazole, itraconazole, ketoconazole, posaconazole, voriconazole Cisapride Citalopram Dronedarone Linezolid MAOIs like Carbex, Eldepryl, Marplan, Nardil, and Parnate Methylene blue (injected into a vein) Pimozide Thioridazine This medication may also interact with the following: Alcohol Amphetamines Aspirin and aspirin-like medications Carbamazepine Certain medications for depression, anxiety, or psychotic disturbances Certain medications for migraine headache like almotriptan, eletriptan, frovatriptan, naratriptan, rizatriptan, sumatriptan, zolmitriptan Certain medications for sleep Certain medications that treat or prevent blood clots like warfarin, enoxaparin, and dalteparin Cimetidine Diuretics Dofetilide Fentanyl Furazolidone Isoniazid Lithium Metoprolol NSAIDs, medications for pain and inflammation, like ibuprofen or naproxen Other medications that prolong the QT interval (cause an abnormal heart rhythm) Procarbazine Rasagiline Supplements like St. John's wort, kava kava, valerian Tramadol Tryptophan Ziprasidone This list may not describe all possible interactions. Give your health care provider a list of all the medicines, herbs, non-prescription drugs, or dietary supplements you use. Also tell them if you smoke, drink alcohol, or use illegal drugs. Some items may interact with your  medicine. What should I watch for while using this medication? Tell your care team if your symptoms do not get  better or if they get worse. Visit your care team for regular checks on your progress. Because it may take several weeks to see the full effects of this medication, it is important to continue your treatment as prescribed by your care team. Watch for new or worsening thoughts of suicide or depression. This includes sudden changes in mood, behaviors, or thoughts. These changes can happen at any time but are more common in the beginning of treatment or after a change in dose. Call your care team right away if you experience these thoughts or worsening depression. Manic episodes may happen in patients with bipolar disorder who take this medication. Watch for changes in feelings or behaviors such as feeling anxious, nervous, agitated, panicky, irritable, hostile, aggressive, impulsive, severely restless, overly excited and hyperactive, or trouble sleeping. These symptoms can happen at any time but are more common in the beginning of treatment or after a change in dose. Call your care team right away if you notice any of these symptoms. You may get drowsy or dizzy. Do not drive, use machinery, or do anything that needs mental alertness until you know how this medication affects you. Do not stand or sit up quickly, especially if you are an older patient. This reduces the risk of dizzy or fainting spells. Alcohol may interfere with the effect of this medication. Avoid alcoholic drinks. Your mouth may get dry. Chewing sugarless gum or sucking hard candy, and drinking plenty of water may help. Contact your care team if the problem does not go away or is severe. What side effects may I notice from receiving this medication? Side effects that you should report to your care team as soon as possible: Allergic reactions--skin rash, itching, hives, swelling of the face, lips, tongue, or throat Bleeding--bloody  or black, tar-like stools, red or dark brown urine, vomiting blood or brown material that looks like coffee grounds, small, red or purple spots on skin, unusual bleeding or bruising Heart rhythm changes--fast or irregular heartbeat, dizziness, feeling faint or lightheaded, chest pain, trouble breathing Low sodium level--muscle weakness, fatigue, dizziness, headache, confusion Serotonin syndrome--irritability, confusion, fast or irregular heartbeat, muscle stiffness, twitching muscles, sweating, high fever, seizure, chills, vomiting, diarrhea Sudden eye pain or change in vision such as blurry vision, seeing halos around lights, vision loss Thoughts of suicide or self-harm, worsening mood, feelings of depression Side effects that usually do not require medical attention (report to your care team if they continue or are bothersome): Change in sex drive or performance Diarrhea Excessive sweating Nausea Tremors or shaking Upset stomach This list may not describe all possible side effects. Call your doctor for medical advice about side effects. You may report side effects to FDA at 1-800-FDA-1088. Where should I keep my medication? Keep out of reach of children and pets. Store at room temperature between 15 and 30 degrees C (59 and 86 degrees F). Throw away any unused medication after the expiration date. NOTE: This sheet is a summary. It may not cover all possible information. If you have questions about this medicine, talk to your doctor, pharmacist, or health care provider.  2023 Elsevier/Gold Standard (2020-06-13 00:00:00)

## 2022-11-21 LAB — COMPREHENSIVE METABOLIC PANEL
ALT: 20 IU/L (ref 0–32)
AST: 22 IU/L (ref 0–40)
Albumin/Globulin Ratio: 2 (ref 1.2–2.2)
Albumin: 4.5 g/dL (ref 4.0–5.0)
Alkaline Phosphatase: 52 IU/L (ref 44–121)
BUN/Creatinine Ratio: 15 (ref 9–23)
BUN: 13 mg/dL (ref 6–20)
Bilirubin Total: 0.4 mg/dL (ref 0.0–1.2)
CO2: 19 mmol/L — ABNORMAL LOW (ref 20–29)
Calcium: 9.5 mg/dL (ref 8.7–10.2)
Chloride: 104 mmol/L (ref 96–106)
Creatinine, Ser: 0.88 mg/dL (ref 0.57–1.00)
Globulin, Total: 2.3 g/dL (ref 1.5–4.5)
Glucose: 82 mg/dL (ref 70–99)
Potassium: 4.3 mmol/L (ref 3.5–5.2)
Sodium: 139 mmol/L (ref 134–144)
Total Protein: 6.8 g/dL (ref 6.0–8.5)
eGFR: 94 mL/min/{1.73_m2} (ref >59)

## 2022-11-21 LAB — CBC WITH DIFFERENTIAL/PLATELET
Basophils Absolute: 0 10*3/uL (ref 0.0–0.2)
Basos: 0 %
EOS (ABSOLUTE): 0.1 10*3/uL (ref 0.0–0.4)
Eos: 2 %
Hematocrit: 40.6 % (ref 34.0–46.6)
Hemoglobin: 13.3 g/dL (ref 11.1–15.9)
Immature Grans (Abs): 0 10*3/uL (ref 0.0–0.1)
Immature Granulocytes: 0 %
Lymphocytes Absolute: 1.8 10*3/uL (ref 0.7–3.1)
Lymphs: 62 %
MCH: 29 pg (ref 26.6–33.0)
MCHC: 32.8 g/dL (ref 31.5–35.7)
MCV: 89 fL (ref 79–97)
Monocytes Absolute: 0.4 10*3/uL (ref 0.1–0.9)
Monocytes: 13 %
Neutrophils Absolute: 0.7 10*3/uL — ABNORMAL LOW (ref 1.4–7.0)
Neutrophils: 23 %
Platelets: 149 10*3/uL — ABNORMAL LOW (ref 150–450)
RBC: 4.59 x10E6/uL (ref 3.77–5.28)
RDW: 12.7 % (ref 11.7–15.4)
WBC: 2.8 10*3/uL — ABNORMAL LOW (ref 3.4–10.8)

## 2022-11-21 LAB — SEDIMENTATION RATE: Sed Rate: 5 mm/hr (ref 0–32)

## 2022-11-21 LAB — HEMOGLOBIN A1C
Est. average glucose Bld gHb Est-mCnc: 114 mg/dL
Hgb A1c MFr Bld: 5.6 % (ref 4.8–5.6)

## 2022-11-21 LAB — RHEUMATOID ARTHRITIS PROFILE
Cyclic Citrullin Peptide Ab: 5 units (ref 0–19)
Rheumatoid fact SerPl-aCnc: <10 IU/mL (ref <14.0)

## 2022-11-22 ENCOUNTER — Other Ambulatory Visit (INDEPENDENT_AMBULATORY_CARE_PROVIDER_SITE_OTHER): Payer: Self-pay | Admitting: Primary Care

## 2022-11-22 ENCOUNTER — Telehealth: Payer: Self-pay | Admitting: Primary Care

## 2022-11-22 DIAGNOSIS — D709 Neutropenia, unspecified: Secondary | ICD-10-CM

## 2022-11-22 NOTE — Telephone Encounter (Signed)
Returned pt call and made aware that provider hasn't looked at labs yet. Made pt aware that once provider sees them and make her comments she will be able to see them through her MyChart. Pt states she understands and doesn't have any questions or concerns

## 2022-11-22 NOTE — Telephone Encounter (Signed)
Copied from CRM [id number]. Topic: General - Other >> Nov 22, 2022 10:24 AM Macon Large wrote: Reason for CRM: Pt called for lab results. Pt requests call back. Cb# [phone]

## 2022-12-06 ENCOUNTER — Encounter (INDEPENDENT_AMBULATORY_CARE_PROVIDER_SITE_OTHER): Payer: Self-pay | Admitting: Primary Care

## 2022-12-06 ENCOUNTER — Ambulatory Visit (INDEPENDENT_AMBULATORY_CARE_PROVIDER_SITE_OTHER): Payer: 59 | Admitting: Primary Care

## 2022-12-06 VITALS — BP 109/77 | HR 91 | Resp 16 | Wt 148.8 lb

## 2022-12-06 DIAGNOSIS — T7840XS Allergy, unspecified, sequela: Secondary | ICD-10-CM

## 2022-12-06 DIAGNOSIS — T7840XD Allergy, unspecified, subsequent encounter: Secondary | ICD-10-CM

## 2022-12-06 DIAGNOSIS — R220 Localized swelling, mass and lump, head: Secondary | ICD-10-CM

## 2022-12-06 MED ORDER — EPINEPHRINE 0.3 MG/0.3ML IJ SOAJ: 0.3000 mg | INTRAMUSCULAR | 1 refills | Status: AC | PRN

## 2022-12-06 NOTE — Progress Notes (Signed)
   Acute Office Visit  Subjective:     Patient ID: Carol Hayes, female    DOB: October 15, 1995, 27 y.o.   MRN: [id number]  Chief Complaint  Patient presents with   Allergic Reaction    Pt states medication is not helping She has been taking extra strength benadryl     Allergic Reaction  Ms.Carol Hayes is a 27 year old female that is very stressful due to  having an allergic reaction which have become severe enough that her mouth and lips swell.  She is trying to figure out what she can do before this happens unfortunately you cannot determine or have oral to allergic reaction or anaphylactic reaction.  It is how her body reacts to a certain environment, food, soaps or lotions.  She is crying during this visit appointment scheduled with heme-onc due to abnormal labs for evaluation.  Her Armour that she needs to have every day is a EpiPen, Benadryl, and her inhaler.  Swelling around the lip mouth 911 call and she is administering EpiPen.  She states that this morning she woke up and her face was red with with an rashes.          ROS Comprehensive ROS Pertinent positive and negative noted in HPI       Objective:    Blood Pressure 109/77   Pulse 91   Respiration 16   Weight 148 lb 12.8 oz (67.5 kg)   Last Menstrual Period 11/05/2022 (Approximate)   Oxygen Saturation 100%   Body Mass Index 20.75 kg/m   Physical Exam Vitals reviewed.  Constitutional:      Appearance: Normal appearance. She is normal weight.  HENT:     Head: Normocephalic.     Right Ear: Tympanic membrane and external ear normal.     Left Ear: Tympanic membrane and external ear normal.     Nose: Nose normal.  Cardiovascular:     Rate and Rhythm: Normal rate and regular rhythm.  Pulmonary:     Effort: Pulmonary effort is normal.     Breath sounds: Normal breath sounds.  Abdominal:     General: Bowel sounds are normal.     Palpations: Abdomen is soft.  Musculoskeletal:        General: Normal  range of motion.     Cervical back: Normal range of motion.  Skin:    General: Skin is warm and dry.  Neurological:     Mental Status: She is alert and oriented to person, place, and time.  Psychiatric:        Mood and Affect: Mood normal.        Behavior: Behavior normal.    No results found for any visits on 12/06/22.      Assessment & Plan:  Vernadean was seen today for allergic reaction.  Diagnoses and all orders for this visit:  Allergic reaction, sequela -     Ambulatory referral to Allergy -     EPINEPHrine 0.3 mg/0.3 mL IJ SOAJ injection; Inject 0.3 mg into the muscle as needed for anaphylaxis.    Meds ordered this encounter  Medications   EPINEPHrine 0.3 mg/0.3 mL IJ SOAJ injection    Sig: Inject 0.3 mg into the muscle as needed for anaphylaxis.    Dispense:  1 each    Refill:  1    Order Specific Question:   Supervising Provider    Answer:   Quentin Angst [[id number]]   After hemoc  Grayce Sessions, NP

## 2022-12-14 ENCOUNTER — Other Ambulatory Visit: Payer: Self-pay | Admitting: Internal Medicine

## 2022-12-14 DIAGNOSIS — D72819 Decreased white blood cell count, unspecified: Secondary | ICD-10-CM

## 2022-12-15 ENCOUNTER — Inpatient Hospital Stay: Payer: 59 | Attending: Internal Medicine | Admitting: Internal Medicine

## 2022-12-15 ENCOUNTER — Inpatient Hospital Stay: Payer: 59

## 2022-12-15 VITALS — BP 119/74 | HR 86 | Temp 98.4°F | Resp 16 | Wt 153.4 lb

## 2022-12-15 DIAGNOSIS — R21 Rash and other nonspecific skin eruption: Secondary | ICD-10-CM | POA: Insufficient documentation

## 2022-12-15 DIAGNOSIS — M7989 Other specified soft tissue disorders: Secondary | ICD-10-CM | POA: Insufficient documentation

## 2022-12-15 DIAGNOSIS — D709 Neutropenia, unspecified: Secondary | ICD-10-CM | POA: Diagnosis present

## 2022-12-15 DIAGNOSIS — D72819 Decreased white blood cell count, unspecified: Secondary | ICD-10-CM | POA: Diagnosis not present

## 2022-12-15 DIAGNOSIS — Z79899 Other long term (current) drug therapy: Secondary | ICD-10-CM | POA: Diagnosis not present

## 2022-12-15 DIAGNOSIS — J45909 Unspecified asthma, uncomplicated: Secondary | ICD-10-CM | POA: Diagnosis not present

## 2022-12-15 LAB — CMP (CANCER CENTER ONLY)
ALT: 17 U/L (ref 0–44)
AST: 18 U/L (ref 15–41)
Albumin: 4.4 g/dL (ref 3.5–5.0)
Alkaline Phosphatase: 48 U/L (ref 38–126)
Anion gap: 7 (ref 5–15)
BUN: 10 mg/dL (ref 6–20)
CO2: 25 mmol/L (ref 22–32)
Calcium: 9.6 mg/dL (ref 8.9–10.3)
Chloride: 107 mmol/L (ref 98–111)
Creatinine: 1.02 mg/dL — ABNORMAL HIGH (ref 0.44–1.00)
GFR, Estimated: >60 mL/min (ref >60)
Glucose, Bld: 92 mg/dL (ref 70–99)
Potassium: 4 mmol/L (ref 3.5–5.1)
Sodium: 139 mmol/L (ref 135–145)
Total Bilirubin: 0.3 mg/dL (ref 0.3–1.2)
Total Protein: 7.4 g/dL (ref 6.5–8.1)

## 2022-12-15 LAB — CBC WITH DIFFERENTIAL (CANCER CENTER ONLY)
Abs Immature Granulocytes: 0.01 10*3/uL (ref 0.00–0.07)
Basophils Absolute: 0 10*3/uL (ref 0.0–0.1)
Basophils Relative: 0 %
Eosinophils Absolute: 0.1 10*3/uL (ref 0.0–0.5)
Eosinophils Relative: 3 %
HCT: 37.9 % (ref 36.0–46.0)
Hemoglobin: 12.5 g/dL (ref 12.0–15.0)
Immature Granulocytes: 0 %
Lymphocytes Relative: 61 %
Lymphs Abs: 2 10*3/uL (ref 0.7–4.0)
MCH: 28.7 pg (ref 26.0–34.0)
MCHC: 33 g/dL (ref 30.0–36.0)
MCV: 87.1 fL (ref 80.0–100.0)
Monocytes Absolute: 0.4 10*3/uL (ref 0.1–1.0)
Monocytes Relative: 12 %
Neutro Abs: 0.8 10*3/uL — ABNORMAL LOW (ref 1.7–7.7)
Neutrophils Relative %: 24 %
Platelet Count: 198 10*3/uL (ref 150–400)
RBC: 4.35 MIL/uL (ref 3.87–5.11)
RDW: 12.8 % (ref 11.5–15.5)
WBC Count: 3.4 10*3/uL — ABNORMAL LOW (ref 4.0–10.5)
nRBC: 0 % (ref 0.0–0.2)

## 2022-12-15 LAB — TSH: TSH: 4.144 u[IU]/mL (ref 0.350–4.500)

## 2022-12-15 LAB — HEPATITIS PANEL, ACUTE
HCV Ab: NONREACTIVE
Hep A IgM: NONREACTIVE
Hep B C IgM: NONREACTIVE
Hepatitis B Surface Ag: NONREACTIVE

## 2022-12-15 LAB — HIV ANTIBODY (ROUTINE TESTING W REFLEX): HIV Screen 4th Generation wRfx: NONREACTIVE

## 2022-12-15 LAB — IRON AND IRON BINDING CAPACITY (CC-WL,HP ONLY)
Iron: 60 ug/dL (ref 28–170)
Saturation Ratios: 17 % (ref 10.4–31.8)
TIBC: 344 ug/dL (ref 250–450)
UIBC: 284 ug/dL (ref 148–442)

## 2022-12-15 LAB — FERRITIN: Ferritin: 32 ng/mL (ref 11–307)

## 2022-12-15 LAB — VITAMIN B12: Vitamin B-12: 340 pg/mL (ref 180–914)

## 2022-12-15 LAB — FOLATE: Folate: 5.7 ng/mL — ABNORMAL LOW (ref >5.9)

## 2022-12-15 MED ORDER — PREDNISONE 10 MG PO TABS: 10.0000 mg | ORAL_TABLET | Freq: Every day | ORAL | 0 refills | Status: DC

## 2022-12-15 NOTE — Progress Notes (Signed)
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 female with past medical history significant for anxiety, asthma and depression.  The patient has been complaining of rash as well as joint pain for the last 2 years.  She also has swelling of the face with burning sensation and frequent flares of the rash that has been almost daily at this point.  She was seen in the past by rheumatology and had elevated ANA.  She was then referred to dermatology for evaluation and biopsy of some of the skin lesion showed inflammatory process consistent with the rheumatologic disorder. The patient was seen recently by her primary care provider for routine evaluation and feeling like her legs fall asleep with sitting.  During her evaluation, she had CBC performed on 11/16/2022 and that showed white blood count of 2.8, hemoglobin was normal at 13.3 and hematocrit 40.6 with platelets count of 149,000.  Absolute neutrophil count was 700. Previous CBC on 09/06/2022 showed total white blood count was 3.3 with normal platelet count.  She has no issues with leukocytopenia until January 14, 2022 when her total white blood count was 3.2.  The patient was referred to me today for evaluation and recommendation regarding her condition.  She was tested for rheumatoid arthritis and her RF was normal.  She also had normal sedimentation rate and comprehensive metabolic panel. When seen today she is feeling fine except for the swelling in the legs as well as the frequent flares of skin rash on the face with occasional palpable small lymph nodes. She denied having any current chest pain but was seen at the emergency department few months ago with pain in the chest and had extensive workup that was unremarkable.  She has no cough, shortness of  breath or hemoptysis.  She has no nausea, vomiting, diarrhea or constipation.  She has no headache or visual changes. Family history significant for mother with hypertension and anxiety.  Father died from complication of diabetes mellitus with heart disease and as well as kidney failure. The patient is single and has no children.  She was accompanied today by her Dennie Bible.  She works for the [employer].  She has no history of smoking but drinks alcohol occasionally and no history of drug abuse.  HPI  Past Medical History:  Diagnosis Date   Anxiety    Asthma    Depression     Past Surgical History:  Procedure Laterality Date   APPENDECTOMY      Family History  Problem Relation Age of Onset   Hypertension Mother    Anxiety disorder Mother    Asthma Mother    Kidney disease Father    Heart disease Father    Diabetes Father     Social History Social History   Tobacco Use   Smoking status: Never   Smokeless tobacco: Never  Vaping Use   Vaping Use: Never used  Substance Use Topics   Alcohol use: No   Drug use: No    Allergies  Allergen Reactions   Latex     Latex adhesive    Current Outpatient Medications  Medication Sig Dispense Refill   albuterol (PROVENTIL HFA;VENTOLIN HFA) 108 (90 Base) MCG/ACT inhaler Inhale into the lungs every 6 (six) hours as needed for wheezing or shortness of breath.  EPINEPHrine 0.3 mg/0.3 mL IJ SOAJ injection Inject 0.3 mg into the muscle as needed for anaphylaxis. 1 each 1   escitalopram (LEXAPRO) 10 MG tablet Take 1 tablet (10 mg total) by mouth daily. 90 tablet 1   hydrOXYzine (ATARAX) 10 MG tablet Take 1 tablet (10 mg total) by mouth every 8 (eight) hours as needed for itching or anxiety. 60 tablet 0   No current facility-administered medications for this visit.    Review of Systems  Constitutional: positive for fatigue Eyes: negative Ears, nose, mouth, throat, and face: negative Respiratory:  negative Cardiovascular: negative Gastrointestinal: negative Genitourinary:negative Integument/breast: positive for rash and skin lesion(s) Hematologic/lymphatic: negative Musculoskeletal:positive for arthralgias and stiff joints Neurological: negative Behavioral/Psych: negative Endocrine: negative Allergic/Immunologic: negative  Physical Exam  ZOX:WRUEA, healthy, no distress, well nourished, well developed, and anxious SKIN: skin color, texture, turgor are normal, no rashes or significant lesions HEAD: Normocephalic, No masses, lesions, tenderness or abnormalities EYES: normal, PERRLA, Conjunctiva are pink and non-injected EARS: External ears normal, Canals clear OROPHARYNX:no exudate, no erythema, and lips, buccal mucosa, and tongue normal  NECK: supple, no adenopathy, no JVD LYMPH:  no palpable lymphadenopathy, no hepatosplenomegaly BREAST:not examined LUNGS: clear to auscultation , and palpation HEART: regular rate & rhythm, no murmurs, and no gallops ABDOMEN:abdomen soft, non-tender, normal bowel sounds, and no masses or organomegaly BACK: Back symmetric, no curvature., No CVA tenderness EXTREMITIES:no joint deformities, effusion, or inflammation, no edema  NEURO: alert & oriented x 3 with fluent speech, no focal motor/sensory deficits  PERFORMANCE STATUS: ECOG 0  LABORATORY DATA: Lab Results  Component Value Date   WBC 2.8 (L) 11/16/2022   HGB 13.3 11/16/2022   HCT 40.6 11/16/2022   MCV 89 11/16/2022   PLT 149 (L) 11/16/2022      Chemistry      Component Value Date/Time   NA 139 11/16/2022 0956   NA 138 10/18/2014 2112   K 4.3 11/16/2022 0956   K 3.0 (L) 10/18/2014 2112   CL 104 11/16/2022 0956   CL 109 10/18/2014 2112   CO2 19 (L) 11/16/2022 0956   CO2 19 (L) 10/18/2014 2112   BUN 13 11/16/2022 0956   BUN 10 10/18/2014 2112   CREATININE 0.88 11/16/2022 0956   CREATININE 1.02 (H) 10/18/2014 2112      Component Value Date/Time   CALCIUM 9.5 11/16/2022  0956   CALCIUM 8.5 (L) 10/18/2014 2112   ALKPHOS 52 11/16/2022 0956   AST 22 11/16/2022 0956   ALT 20 11/16/2022 0956   BILITOT 0.4 11/16/2022 0956       RADIOGRAPHIC STUDIES: No results found.  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 The patient has been on treatment with Lexapro but only for 2 weeks and this was discontinued.  This medication can also cause leukocytopenia and thrombocytopenia but unlikely the underlying etiology send The patient has this abnormality for more than 1 year.  PLAN: I had a lengthy discussion with the patient and her sister today about her condition and further investigation to rule out any other underlying etiology as well as treatment options. I ordered several studies today to rule out any other underlying etiology including repeat CBC, comprehensive metabolic panel, iron study and ferritin, vitamin B12 and serum folate in addition to ANA, acute hepatitis panel and HIV as well as TSH. I explained to the patient that this is likely  secondary to her autoimmune disorder and treatment would be considered for the underlying etiology by her rheumatologist but a short course of prednisone may help relieve her symptoms.  The patient has an appointment with allergy medicine in early July 2024. She is interested in proceeding with a short course of prednisone for now. I will see her back for follow-up visit in 3 months for evaluation and repeat blood work. She was advised to call immediately if she has any other concerning symptoms in the interval. The patient voices understanding of current disease status and treatment options and is in agreement with the current care plan.  All questions were answered. The patient knows to call the clinic with any problems, questions or concerns. We can  certainly see the patient much sooner if necessary.  Thank you so much for allowing me to participate in the care of Carol Hayes. I will continue to follow up the patient with you and assist in her care.  The total time spent in the appointment was 60 minutes.  Disclaimer: This note was dictated with voice recognition software. Similar sounding words can inadvertently be transcribed and may not be corrected upon review.   Lajuana Matte December 15, 2022, 8:06 AM

## 2022-12-19 LAB — ANTINUCLEAR ANTIBODIES, IFA: ANA Ab, IFA: NEGATIVE

## 2022-12-20 ENCOUNTER — Other Ambulatory Visit: Payer: Self-pay

## 2022-12-20 ENCOUNTER — Ambulatory Visit (INDEPENDENT_AMBULATORY_CARE_PROVIDER_SITE_OTHER): Payer: 59 | Admitting: Allergy & Immunology

## 2022-12-20 ENCOUNTER — Encounter: Payer: Self-pay | Admitting: Allergy & Immunology

## 2022-12-20 VITALS — BP 120/78 | HR 77 | Temp 98.5°F | Resp 16 | Ht 70.0 in | Wt 152.5 lb

## 2022-12-20 DIAGNOSIS — L508 Other urticaria: Secondary | ICD-10-CM | POA: Diagnosis not present

## 2022-12-20 DIAGNOSIS — D709 Neutropenia, unspecified: Secondary | ICD-10-CM

## 2022-12-20 DIAGNOSIS — J302 Other seasonal allergic rhinitis: Secondary | ICD-10-CM | POA: Diagnosis not present

## 2022-12-20 DIAGNOSIS — M2550 Pain in unspecified joint: Secondary | ICD-10-CM

## 2022-12-20 MED ORDER — FAMOTIDINE 20 MG PO TABS: 20.0000 mg | ORAL_TABLET | Freq: Two times a day (BID) | ORAL | 1 refills | Status: DC

## 2022-12-20 MED ORDER — MONTELUKAST SODIUM 10 MG PO TABS: 10.0000 mg | ORAL_TABLET | Freq: Every day | ORAL | 1 refills | Status: DC

## 2022-12-20 NOTE — Progress Notes (Signed)
NEW PATIENT  Date of Service/Encounter:  12/20/22  Consult requested by: Pcp, No   Assessment:   Chronic urticaria - sent labs today  Seasonal allergic rhinitis - did not do testing since she had recently taken antihistamines  Joint pains - with negative anti-CCP antibody  Neutropenia - sees hematology/oncology (Dr. Arbutus Ped)  Plan/Recommendations:    1. Chronic urticaria - Your history does not have any "red flags" such as fevers, joint pains, or permanent skin changes that would be concerning for a more serious cause of hives.  - We will get some labs to rule out serious causes of hives: tryptase level, chronic urticaria panel, alpha gal panel, ESR, and CRP. - Chronic hives are often times a self limited process and will "burn themselves out" over 6-12 months, although this is not always the case.  - In the meantime, start suppressive dosing of antihistamines:   - Morning: Allegra (fexofenadine) 1-2 tablets + Pepcid (famotidine) 20mg   - Evening: Zyrtec (cetirizine) 2 tablets + Pepcid (famotidine) 20mg  + Singulair (montelukast) 10mg  - You can change this dosing at home, decreasing the dose as needed or increasing the dosing as needed.  - If you are not tolerating the medications or are tired of taking them every day, we can start treatment with a monthly injectable medication called Xolair.   2. Return in about 2 months (around 02/19/2023). You can have the follow up appointment with Dr. Dellis Anes or a Nurse Practicioner (our Nurse Practitioners are excellent and always have Physician oversight!).   This note in its entirety was forwarded to the Provider who requested this consultation.  Subjective:   Carol Hayes is a 27 y.o. female presenting today for evaluation of  Chief Complaint  Patient presents with   Allergic Reaction   Angioedema   Urticaria    Carol Hayes has a history of the following: Patient Active Problem List   Diagnosis Date Noted    Paragard IUD (intrauterine device) in place since 2021 06/04/2022    History obtained from: chart review and patient.  Carol Hayes was referred by Pcp, No.     Carol Hayes is a 27 y.o. female presenting for an evaluation of breakouts . This started around one year ago or so, but it has become more frequent. She had a mild breakout on her face at night weekly. But it has become more and more frequent. This years it has been particularly bad. It was a few times per week and now it is every single day. She has not hanged anything at all. She has checked her bedding and apartment for mold. She is using an Consulting civil engineer. She has done body washes and face soaps. She has been noting her diet and has not hcanged anything or noticed that it was causing issues. It was poredominantly at night in the past, but now it is anytime during the day.  It lasts until she takes an antihistamine. It goes away quickly with the antihistamine - typically Benadryl.   She shows me pictures that are definitely hives. She has some throat swelling sometimes as well. This started around 3 weeks ago. It never got to the point where it is very severe. The second that this starts, she is on it and treats it with Benadryl. She doesn ot like Benadryl because she has to work and it puts her to sleep. She has tried Careers adviser in the past which is not as effective as Benadryl.   She did go to see her  PCP in May 2024. She saw NP Gwinda Passe. She has joint pains which have been recurring. She had an RA profile that was negative. Ana was negative. She did have a CBC that showed neutropenia. She is currently following with Dr. Arbutus Ped. It had increased back to a WBC of 3400 with an ANC of 800. This was slightly better than when her PCP drew them.   She has no history of recurrent infections.  The neutropenia is came out of the blue just on routine skin screening from a PCP establishment visit.  Looking back, it does seem that she has  had an elevated ANA in the past.  She also has a history of multiple ER visits for pelvic pain and abnormal uterine bleeding.  She she has a few visits for anxiety.   Otherwise, there is no history of other atopic diseases, including drug allergies, stinging insect allergies, or contact dermatitis. There is no significant infectious history. Vaccinations are up to date.    Past Medical History: Patient Active Problem List   Diagnosis Date Noted   Paragard IUD (intrauterine device) in place since 2021 06/04/2022    Medication List:  Allergies as of 12/20/2022       Reactions   Kiwi Extract Anaphylaxis, Hives, Itching   Tape Rash   Latex    Latex adhesive        Medication List        Accurate as of December 20, 2022 12:51 PM. If you have any questions, ask your nurse or doctor.          albuterol 108 (90 Base) MCG/ACT inhaler Commonly known as: VENTOLIN HFA Inhale into the lungs every 6 (six) hours as needed for wheezing or shortness of breath.   diphenhydrAMINE 25 mg capsule Commonly known as: BENADRYL Take 25 mg by mouth every 4 (four) hours as needed for allergies.   EPINEPHrine 0.3 mg/0.3 mL Soaj injection Commonly known as: EPI-PEN Inject 0.3 mg into the muscle as needed for anaphylaxis.   escitalopram 10 MG tablet Commonly known as: Lexapro Take 1 tablet (10 mg total) by mouth daily.   famotidine 20 MG tablet Commonly known as: Pepcid Take 1 tablet (20 mg total) by mouth 2 (two) times daily. Started by: Alfonse Spruce, MD   hydrOXYzine 10 MG tablet Commonly known as: ATARAX Take 1 tablet (10 mg total) by mouth every 8 (eight) hours as needed for itching or anxiety.   montelukast 10 MG tablet Commonly known as: Singulair Take 1 tablet (10 mg total) by mouth at bedtime. Started by: Alfonse Spruce, MD   predniSONE 10 MG tablet Commonly known as: DELTASONE Take 1 tablet (10 mg total) by mouth daily with breakfast.        Birth History:  born at term without complications  Developmental History: non-contributory  Past Surgical History: Past Surgical History:  Procedure Laterality Date   APPENDECTOMY       Family History: Family History  Problem Relation Age of Onset   Allergic rhinitis Mother    Hypertension Mother    Anxiety disorder Mother    Asthma Mother    Kidney disease Father    Heart disease Father    Diabetes Father    Allergic rhinitis Sister    Eczema Sister    Asthma Sister    Allergic rhinitis Brother    Asthma Brother      Social History: Carol Hayes lives at home with her two dogs. 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  She was going to do something in [place], but she became overwhelmed.  She also had her dad died last year.  She currently lives in apartment that was built in the 1970s or 1980s.  She has gas heating and central cooling.  There are no dust mite covers on the bedding.  There is no tobacco exposure.  She does use a HEPA filter in the home.  She does not live near an interstate or industrial area.  She is not exposed to fumes, chemicals, or dust.   Review of Systems  Constitutional: Negative.  Negative for chills, fever, malaise/fatigue and weight loss.  HENT: Negative.  Negative for congestion, ear discharge and ear pain.   Eyes:  Negative for pain, discharge and redness.  Respiratory:  Negative for cough, sputum production, shortness of breath and wheezing.   Cardiovascular: Negative.  Negative for chest pain and palpitations.  Gastrointestinal:  Negative for abdominal pain, heartburn, nausea and vomiting.  Musculoskeletal:  Positive for joint pain.  Skin:  Positive for itching and rash.  Neurological:  Negative for dizziness and headaches.  Endo/Heme/Allergies:  Positive for environmental allergies. Does not bruise/bleed easily.       Objective:    Blood pressure 120/78, pulse 77, temperature 98.5 F (36.9 C), resp. rate 16, height 5\' 10"  (1.778 m), weight 152 lb 8 oz (69.2 kg), SpO2 99 %. Body mass index is 21.88 kg/m.     Physical Exam Vitals reviewed.  Constitutional:      Appearance: She is well-developed.     Comments: Talkative.  HENT:     Head: Normocephalic and atraumatic.     Right Ear: Tympanic membrane, ear canal and external ear normal. No drainage, swelling or tenderness. Tympanic membrane is not injected, scarred, erythematous, retracted or bulging.     Left Ear: Tympanic membrane, ear canal and external ear normal. No drainage, swelling or tenderness. Tympanic membrane is not injected, scarred, erythematous, retracted or bulging.     Nose: No nasal deformity, septal deviation, mucosal edema or rhinorrhea.     Right Turbinates: Enlarged, swollen and pale.     Left Turbinates: Enlarged, swollen and pale.     Right Sinus: No maxillary sinus tenderness or frontal sinus tenderness.     Left Sinus: No maxillary sinus tenderness or frontal sinus tenderness.     Mouth/Throat:     Mouth: Mucous membranes are not pale and not dry.     Pharynx: Uvula midline.  Eyes:     General:        Right eye: No discharge.        Left eye: No discharge.     Conjunctiva/sclera: Conjunctivae normal.     Right eye: Right conjunctiva is not injected. No chemosis.    Left eye: Left conjunctiva is not injected. No chemosis.    Pupils: Pupils are equal, round, and reactive to light.  Cardiovascular:     Rate and Rhythm: Normal rate and regular rhythm.     Heart sounds: Normal heart sounds.  Pulmonary:     Effort: Pulmonary effort is normal. No tachypnea, accessory muscle usage or respiratory distress.     Breath sounds: Normal breath sounds. No wheezing, rhonchi or rales.     Comments: Moving air well in all lung fields.  No increased work of breathing. Chest:     Chest  wall: No tenderness.  Abdominal:     Tenderness: There is no  abdominal tenderness. There is no guarding or rebound.  Lymphadenopathy:     Head:     Right side of head: No submandibular, tonsillar or occipital adenopathy.     Left side of head: No submandibular, tonsillar or occipital adenopathy.     Cervical: No cervical adenopathy.  Skin:    Coloration: Skin is not pale.     Findings: No abrasion, erythema, petechiae or rash. Rash is not papular, urticarial or vesicular.  Neurological:     Mental Status: She is alert.  Psychiatric:        Behavior: Behavior is cooperative.      Diagnostic studies: labs sent instead           Malachi Bonds, MD [facility]

## 2022-12-20 NOTE — Patient Instructions (Addendum)
1. Chronic urticaria - Your history does not have any "red flags" such as fevers, joint pains, or permanent skin changes that would be concerning for a more serious cause of hives.  - We will get some labs to rule out serious causes of hives: tryptase level, chronic urticaria panel, alpha gal panel, ESR, and CRP. - Chronic hives are often times a self limited process and will "burn themselves out" over 6-12 months, although this is not always the case.  - In the meantime, start suppressive dosing of antihistamines:   - Morning: Allegra (fexofenadine) 1-2 tablets + Pepcid (famotidine) 20mg   - Evening: Zyrtec (cetirizine) 2 tablets + Pepcid (famotidine) 20mg  + Singulair (montelukast) 10mg  - You can change this dosing at home, decreasing the dose as needed or increasing the dosing as needed.  - If you are not tolerating the medications or are tired of taking them every day, we can start treatment with a monthly injectable medication called Xolair.   2. Return in about 2 months (around 02/19/2023). You can have the follow up appointment with Dr. Dellis Anes or a Nurse Practicioner (our Nurse Practitioners are excellent and always have Physician oversight!).    Please inform us of any Emergency Department visits, hospitalizations, or changes in symptoms. Call us before going to the ED for breathing or allergy symptoms since we might be able to fit you in for a sick visit. Feel free to contact us anytime with any questions, problems, or concerns.  It was a pleasure to meet you today!  Websites that have reliable patient information: 1. American Academy of Asthma, Allergy, and Immunology: www.aaaai.org 2. Food Allergy Research and Education (FARE): foodallergy.org 3. Mothers of Asthmatics: http://www.asthmacommunitynetwork.org 4. American College of Allergy, Asthma, and Immunology: www.acaai.org   COVID-19 Vaccine Information can be found at:  [email or web address].nl For questions related to vaccine distribution or appointments, please email [email or web address] or call [phone].   We realize that you might be concerned about having an allergic reaction to the COVID19 vaccines. To help with that concern, WE ARE OFFERING THE COVID19 VACCINES IN OUR OFFICE! Ask the front desk for dates!     "Like" [place] on Facebook and Instagram for our latest updates!      A healthy democracy works best when [employer] participate! Make sure you are registered to vote! If you have moved or changed any of your contact information, you will need to get this updated before voting!  In some cases, you MAY be able to register to vote online: [email or web address]

## 2022-12-21 LAB — ALLERGENS W/COMP RFLX AREA 2

## 2022-12-21 LAB — THYROID ANTIBODIES: Thyroperoxidase Ab SerPl-aCnc: 29 IU/mL (ref 0–34)

## 2022-12-21 LAB — CHRONIC URTICARIA

## 2022-12-22 LAB — ALLERGENS W/COMP RFLX AREA 2

## 2022-12-22 LAB — SEDIMENTATION RATE: Sed Rate: 3 mm/hr (ref 0–32)

## 2022-12-23 LAB — ALPHA-GAL PANEL
Allergen Lamb IgE: <0.1 kU/L
Beef IgE: <0.1 kU/L
IgE (Immunoglobulin E), Serum: 378 IU/mL (ref 6–495)
O215-IgE Alpha-Gal: <0.1 kU/L
Pork IgE: <0.1 kU/L

## 2022-12-23 LAB — ALLERGENS W/COMP RFLX AREA 2
Alternaria Alternata IgE: <0.1 kU/L
Cladosporium Herbarum IgE: <0.1 kU/L
E001-IgE Cat Dander: <0.1 kU/L
Maple/Box Elder IgE: <0.1 kU/L
Pecan, Hickory IgE: <0.1 kU/L
White Mulberry IgE: <0.1 kU/L

## 2022-12-23 LAB — THYROID ANTIBODIES: Thyroglobulin Antibody: 12.9 IU/mL — ABNORMAL HIGH (ref 0.0–0.9)

## 2022-12-23 LAB — TRYPTASE: Tryptase: 6.6 ug/L (ref 2.2–13.2)

## 2022-12-23 LAB — C-REACTIVE PROTEIN: CRP: <1 mg/L (ref 0–10)

## 2022-12-25 LAB — ALLERGENS W/COMP RFLX AREA 2
Bermuda Grass IgE: <0.1 kU/L
Cedar, Mountain IgE: 0.13 kU/L — AB
Cockroach, German IgE: 0.38 kU/L — AB
D Pteronyssinus IgE: 25.2 kU/L — AB
Elm, American IgE: <0.1 kU/L
IgE (Immunoglobulin E), Serum: 357 IU/mL (ref 6–495)
Johnson Grass IgE: <0.1 kU/L
Mouse Urine IgE: <0.1 kU/L
Pigweed, Rough IgE: <0.1 kU/L
Ragweed, Short IgE: <0.1 kU/L

## 2023-01-08 ENCOUNTER — Ambulatory Visit: Payer: 59 | Admitting: Allergy & Immunology

## 2023-01-14 ENCOUNTER — Ambulatory Visit (INDEPENDENT_AMBULATORY_CARE_PROVIDER_SITE_OTHER): Payer: 59 | Admitting: Primary Care

## 2023-03-05 ENCOUNTER — Ambulatory Visit (INDEPENDENT_AMBULATORY_CARE_PROVIDER_SITE_OTHER): Payer: 59 | Admitting: Allergy & Immunology

## 2023-03-05 ENCOUNTER — Encounter: Payer: Self-pay | Admitting: Allergy & Immunology

## 2023-03-05 ENCOUNTER — Other Ambulatory Visit: Payer: Self-pay

## 2023-03-05 VITALS — BP 118/80 | HR 80 | Temp 97.9°F | Resp 18 | Ht 70.0 in | Wt 155.5 lb

## 2023-03-05 DIAGNOSIS — J3089 Other allergic rhinitis: Secondary | ICD-10-CM | POA: Diagnosis not present

## 2023-03-05 DIAGNOSIS — J4520 Mild intermittent asthma, uncomplicated: Secondary | ICD-10-CM

## 2023-03-05 DIAGNOSIS — R768 Other specified abnormal immunological findings in serum: Secondary | ICD-10-CM

## 2023-03-05 DIAGNOSIS — R21 Rash and other nonspecific skin eruption: Secondary | ICD-10-CM

## 2023-03-05 DIAGNOSIS — R600 Localized edema: Secondary | ICD-10-CM

## 2023-03-05 DIAGNOSIS — L508 Other urticaria: Secondary | ICD-10-CM

## 2023-03-05 DIAGNOSIS — J302 Other seasonal allergic rhinitis: Secondary | ICD-10-CM

## 2023-03-05 MED ORDER — MONTELUKAST SODIUM 10 MG PO TABS: 10.0000 mg | ORAL_TABLET | Freq: Every day | ORAL | 5 refills | Status: AC

## 2023-03-05 MED ORDER — FAMOTIDINE 20 MG PO TABS: 20.0000 mg | ORAL_TABLET | Freq: Two times a day (BID) | ORAL | 5 refills | Status: AC

## 2023-03-05 MED ORDER — HYDROXYZINE HCL 10 MG PO TABS: 10.0000 mg | ORAL_TABLET | Freq: Three times a day (TID) | ORAL | 5 refills | Status: AC | PRN

## 2023-03-05 NOTE — Patient Instructions (Addendum)
1. Chronic urticaria - We are going to get thyroid labs since you had the elevated anti-thyroid antibodies. - We are going to re-check the inflammatory markers including ANA. - We are going to check for the most common foods, but I agree that an elimination diet is the way to go.  - We are going to look for Celiac due to the rash on the fingers. - I am not sure what to think about the fatigue.  - In the meantime, continue with suppressive dosing of antihistamines:   - Morning: Allegra (fexofenadine) 1-2 tablets + Pepcid (famotidine) 20mg   - Evening: Zyrtec (cetirizine) 2 tablets + Pepcid (famotidine) 20mg  + Singulair (montelukast) 10mg  - Wean these as tolerated at home.  - If you are not tolerating the medications or are tired of taking them every day, we can start treatment with a monthly injectable medication called Xolair.   2. Return in about 3 months (around 06/05/2023).    Please inform us of any Emergency Department visits, hospitalizations, or changes in symptoms. Call us before going to the ED for breathing or allergy symptoms since we might be able to fit you in for a sick visit. Feel free to contact us anytime with any questions, problems, or concerns.  It was a pleasure to see you again today!  Websites that have reliable patient information: 1. American Academy of Asthma, Allergy, and Immunology: www.aaaai.org 2. Food Allergy Research and Education (FARE): foodallergy.org 3. Mothers of Asthmatics: http://www.asthmacommunitynetwork.org 4. American College of Allergy, Asthma, and Immunology: www.acaai.org   COVID-19 Vaccine Information can be found at: [email or web address].nl For questions related to vaccine distribution or appointments, please email [email or web address] or call [phone].   We realize that you might be concerned about having an allergic reaction to the COVID19 vaccines. To help with that concern, WE  ARE OFFERING THE COVID19 VACCINES IN OUR OFFICE! Ask the front desk for dates!     "Like" [place] on Facebook and Instagram for our latest updates!      A healthy democracy works best when [employer] participate! Make sure you are registered to vote! If you have moved or changed any of your contact information, you will need to get this updated before voting!  In some cases, you MAY be able to register to vote online: [email or web address]

## 2023-03-05 NOTE — Progress Notes (Signed)
FOLLOW UP  Date of Service/Encounter:  03/05/23   Assessment:   Chronic urticaria - sent labs today   Seasonal allergic rhinitis - did not do testing since she had recently taken antihistamines   Joint pains - with negative anti-CCP antibody   Neutropenia - sees hematology/oncology (Dr. Arbutus Ped)  Plan/Recommendations:   1. Chronic urticaria - We are going to get thyroid labs since you had the elevated anti-thyroid antibodies. - We are going to re-check the inflammatory markers including ANA. - We are going to check for the most common foods, but I agree that an elimination diet is the way to go.  - We are going to look for Celiac due to the rash on the fingers. - I am not sure what to think about the fatigue.  - In the meantime, continue with suppressive dosing of antihistamines:   - Morning: Allegra (fexofenadine) 1-2 tablets + Pepcid (famotidine) 20mg   - Evening: Zyrtec (cetirizine) 2 tablets + Pepcid (famotidine) 20mg  + Singulair (montelukast) 10mg  - Wean these as tolerated at home.  - If you are not tolerating the medications or are tired of taking them every day, we can start treatment with a monthly injectable medication called Xolair.   2. Return in about 3 months (around 06/05/2023).    Subjective:   Carol Hayes is a 27 y.o. female presenting today for follow up of  Chief Complaint  Patient presents with   Urticaria    Hives - off of medication, weight gain, body pain , and discoloration around her neck with pain, bloating, and swelling - for a month no sure if it is due to foods    Other    Scalp irritation - with patches redness - has pictures    Asthma    No issues    Angioedema    Carol Hayes has a history of the following: Patient Active Problem List   Diagnosis Date Noted   Paragard IUD (intrauterine device) in place since 2021 06/04/2022    History obtained from: chart review and patient.  Carol Hayes is a 27 y.o. female presenting  for a follow up visit.  She was last seen in June 2024.  At that time, we worked her up for urticaria and put her on Allegra and Pepcid in the morning and Zyrtec and Pepcid and Singulair at night.  We did do a number of labs.  She had slightly elevated thyroglobulin antibodies.  She also had an environmental allergy panel that was very hide to dust mites with lower levels cockroach and tree pollen.  She had a negative alpha gal panel.  Chronic hive panel was negative.  Inflammatory markers were negative.  Tryptase was negative.  In the interim, she has some new symptoms. She reports that for the past month, she has had some fatigue and drowsiness. She is having some swelling and discoloration on her neck. She reports that this is a clammy sensation and a stickiness. She has been trying to exfoliate. It is sometimes pruritic, but not right now. This is all new since June 2024. She has had some bloating that she felt was associated with her period. She also reports that her stomach will become rotund intermittently. This is slightly painful. CMP was normal in early June 2024. She is wondering whether she would eliminate things in her diet.   She does have the rash on her hands. This is rather painful.   She has gained around 15-20 pounds since the last visit. She  thinks that her metabolism might just be slowing down. She eats one big meal each day and sometimes intermittent stuff. She has had several issues with her health since COVID-19 (she has had all of her vaccines).   She remains on the Allegra and Pepcid in the morning and the cetirizine and Pepcid at night. She is on the montelukast. But   She has gone to see [facility]. They did a topical steroid which did not help. She had a biopsy which showed an "inflammatory response". She had testing that was positive to Sjogren's syndrome. This was Dr. Nickola Major at [facility].   Otherwise, there have been no changes to her past medical  history, surgical history, family history, or social history.    Review of systems otherwise negative other than that mentioned in the HPI.    Objective:   Blood pressure 118/80, pulse 80, temperature 97.9 F (36.6 C), resp. rate 18, height 5\' 10"  (1.778 m), weight 155 lb 8 oz (70.5 kg), SpO2 99%. Body mass index is 22.31 kg/m.    Physical Exam Vitals reviewed.  Constitutional:      Appearance: She is well-developed.     Comments: Talkative.  HENT:     Head: Normocephalic and atraumatic.     Right Ear: Tympanic membrane, ear canal and external ear normal. No drainage, swelling or tenderness. Tympanic membrane is not injected, scarred, erythematous, retracted or bulging.     Left Ear: Tympanic membrane, ear canal and external ear normal. No drainage, swelling or tenderness. Tympanic membrane is not injected, scarred, erythematous, retracted or bulging.     Nose: No nasal deformity, septal deviation, mucosal edema or rhinorrhea.     Right Turbinates: Enlarged, swollen and pale.     Left Turbinates: Enlarged, swollen and pale.     Right Sinus: No maxillary sinus tenderness or frontal sinus tenderness.     Left Sinus: No maxillary sinus tenderness or frontal sinus tenderness.     Comments: No nasal polyps noted.     Mouth/Throat:     Lips: Pink.     Mouth: Mucous membranes are moist. Mucous membranes are not pale and not dry.     Pharynx: Uvula midline.  Eyes:     General: Lids are normal. Allergic shiner present.        Right eye: No discharge.        Left eye: No discharge.     Conjunctiva/sclera: Conjunctivae normal.     Right eye: Right conjunctiva is not injected. No chemosis.    Left eye: Left conjunctiva is not injected. No chemosis.    Pupils: Pupils are equal, round, and reactive to light.  Cardiovascular:     Rate and Rhythm: Normal rate and regular rhythm.     Heart sounds: Normal heart sounds.  Pulmonary:     Effort: Pulmonary effort is normal. No tachypnea,  accessory muscle usage or respiratory distress.     Breath sounds: Normal breath sounds. No wheezing, rhonchi or rales.     Comments: Moving air well in all lung fields.  No increased work of breathing. Chest:     Chest wall: No tenderness.  Abdominal:     Tenderness: There is no abdominal tenderness. There is no guarding or rebound.  Lymphadenopathy:     Head:     Right side of head: No submandibular, tonsillar or occipital adenopathy.     Left side of head: No submandibular, tonsillar or occipital adenopathy.     Cervical: No  cervical adenopathy.  Skin:    Coloration: Skin is not pale.     Findings: No abrasion, erythema, petechiae or rash. Rash is not papular, urticarial or vesicular.  Neurological:     Mental Status: She is alert.  Psychiatric:        Behavior: Behavior is cooperative.      Diagnostic studies: labs sent instead      Carol Bonds, MD  [facility]

## 2023-03-07 LAB — TSH+FREE T4
Free T4: 1.15 ng/dL (ref 0.82–1.77)
TSH: 2.87 u[IU]/mL (ref 0.450–4.500)

## 2023-03-07 LAB — CELIAC DISEASE AB SCREEN W/RFX
Antigliadin Abs, IgA: 5 U (ref 0–19)
IgA/Immunoglobulin A, Serum: 197 mg/dL (ref 87–352)
Transglutaminase IgA: <2 U/mL (ref 0–3)

## 2023-03-12 ENCOUNTER — Encounter: Payer: Self-pay | Admitting: Allergy & Immunology

## 2023-03-18 ENCOUNTER — Other Ambulatory Visit: Payer: Self-pay

## 2023-03-18 ENCOUNTER — Inpatient Hospital Stay (HOSPITAL_BASED_OUTPATIENT_CLINIC_OR_DEPARTMENT_OTHER): Payer: 59 | Admitting: Internal Medicine

## 2023-03-18 ENCOUNTER — Inpatient Hospital Stay: Payer: 59 | Attending: Internal Medicine

## 2023-03-18 VITALS — BP 113/66 | HR 72 | Temp 98.5°F | Resp 17 | Ht 70.0 in | Wt 157.2 lb

## 2023-03-18 DIAGNOSIS — Z79899 Other long term (current) drug therapy: Secondary | ICD-10-CM | POA: Insufficient documentation

## 2023-03-18 DIAGNOSIS — D72819 Decreased white blood cell count, unspecified: Secondary | ICD-10-CM | POA: Insufficient documentation

## 2023-03-18 DIAGNOSIS — D709 Neutropenia, unspecified: Secondary | ICD-10-CM | POA: Insufficient documentation

## 2023-03-18 LAB — CBC WITH DIFFERENTIAL (CANCER CENTER ONLY)
Abs Immature Granulocytes: 0 10*3/uL (ref 0.00–0.07)
Basophils Absolute: 0 10*3/uL (ref 0.0–0.1)
Basophils Relative: 1 %
Eosinophils Absolute: 0.1 10*3/uL (ref 0.0–0.5)
Eosinophils Relative: 2 %
HCT: 38.3 % (ref 36.0–46.0)
Hemoglobin: 12.6 g/dL (ref 12.0–15.0)
Immature Granulocytes: 0 %
Lymphocytes Relative: 51 %
Lymphs Abs: 2.1 10*3/uL (ref 0.7–4.0)
MCH: 28.6 pg (ref 26.0–34.0)
MCHC: 32.9 g/dL (ref 30.0–36.0)
MCV: 87 fL (ref 80.0–100.0)
Monocytes Absolute: 0.5 10*3/uL (ref 0.1–1.0)
Monocytes Relative: 12 %
Neutro Abs: 1.4 10*3/uL — ABNORMAL LOW (ref 1.7–7.7)
Neutrophils Relative %: 34 %
Platelet Count: 214 10*3/uL (ref 150–400)
RBC: 4.4 MIL/uL (ref 3.87–5.11)
RDW: 13.1 % (ref 11.5–15.5)
WBC Count: 4.1 10*3/uL (ref 4.0–10.5)
nRBC: 0 % (ref 0.0–0.2)

## 2023-03-18 LAB — LACTATE DEHYDROGENASE: LDH: 111 U/L (ref 98–192)

## 2023-03-18 NOTE — Progress Notes (Signed)
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  Resolved  PRIOR THERAPY: None  CURRENT THERAPY: Observation  INTERVAL HISTORY: Carol Hayes 27 y.o. female returns to the clinic today for follow-up visit.  The patient is feeling fine today with no concerning complaints.  She denied having any current chest pain, shortness of breath, cough or hemoptysis.  She has no nausea, vomiting, abdominal pain, diarrhea or constipation.  She has not fever or chills.  She denied having any bleeding, bruises or ecchymosis.  She had extensive workup for evaluation of her leukocytopenia and these were unremarkable except for low folic acid.  The patient is here today for evaluation and repeat blood work.  MEDICAL HISTORY: Past Medical History:  Diagnosis Date   Angio-edema    Anxiety    Asthma    Depression    Urticaria     ALLERGIES:  is allergic to kiwi extract, tape, and latex.  MEDICATIONS:  Current Outpatient Medications  Medication Sig Dispense Refill   albuterol (PROVENTIL HFA;VENTOLIN HFA) 108 (90 Base) MCG/ACT inhaler Inhale into the lungs every 6 (six) hours as needed for wheezing or shortness of breath.     diphenhydrAMINE (BENADRYL) 25 mg capsule Take 25 mg by mouth every 4 (four) hours as needed for allergies.     EPINEPHrine 0.3 mg/0.3 mL IJ SOAJ injection Inject 0.3 mg into the muscle as needed for anaphylaxis. 1 each 1   escitalopram (LEXAPRO) 10 MG tablet Take 1 tablet (10 mg total) by mouth daily. 90 tablet 1   famotidine (PEPCID) 20 MG tablet Take 1 tablet (20 mg total) by mouth 2 (two) times daily. 60 tablet 5   hydrOXYzine (ATARAX) 10 MG tablet Take 1 tablet (10 mg total) by mouth every 8 (eight) hours as  needed for itching or anxiety. 60 tablet 5   montelukast (SINGULAIR) 10 MG tablet Take 1 tablet (10 mg total) by mouth at bedtime. 30 tablet 5   No current facility-administered medications for this visit.    SURGICAL HISTORY:  Past Surgical History:  Procedure Laterality Date   APPENDECTOMY      REVIEW OF SYSTEMS:  A comprehensive review of systems was negative.   PHYSICAL EXAMINATION: General appearance: alert, cooperative, and no distress Head: Normocephalic, without obvious abnormality, atraumatic Neck: no adenopathy, no JVD, supple, symmetrical, trachea midline, and thyroid not enlarged, symmetric, no tenderness/mass/nodules Lymph nodes: Cervical, supraclavicular, and axillary nodes normal. Resp: clear to auscultation bilaterally Back: symmetric, no curvature. ROM normal. No CVA tenderness. Cardio: regular rate and rhythm, S1, S2 normal, no murmur, click, rub or gallop GI: soft, non-tender; bowel sounds normal; no masses,  no organomegaly Extremities: extremities normal, atraumatic, no cyanosis or edema  ECOG PERFORMANCE STATUS: 0 - Asymptomatic  Blood pressure 113/66, pulse 72, temperature 98.5 F (36.9 C), temperature source Oral, resp. rate 17, height 5\' 10"  (1.778 m), weight 157 lb 3.2 oz (71.3 kg), SpO2 100%.  LABORATORY DATA: Lab Results  Component Value Date   WBC 4.1 03/18/2023   HGB 12.6 03/18/2023   HCT 38.3 03/18/2023   MCV 87.0 03/18/2023   PLT 214 03/18/2023      Chemistry      Component Value Date/Time   NA 139 12/15/2022 0850   NA 139 11/16/2022 0956  NA 138 10/18/2014 2112   K 4.0 12/15/2022 0850   K 3.0 (L) 10/18/2014 2112   CL 107 12/15/2022 0850   CL 109 10/18/2014 2112   CO2 25 12/15/2022 0850   CO2 19 (L) 10/18/2014 2112   BUN 10 12/15/2022 0850   BUN 13 11/16/2022 0956   BUN 10 10/18/2014 2112   CREATININE 1.02 (H) 12/15/2022 0850   CREATININE 1.02 (H) 10/18/2014 2112      Component Value Date/Time   CALCIUM 9.6 12/15/2022 0850    CALCIUM 8.5 (L) 10/18/2014 2112   ALKPHOS 48 12/15/2022 0850   AST 18 12/15/2022 0850   ALT 17 12/15/2022 0850   BILITOT 0.3 12/15/2022 0850       RADIOGRAPHIC STUDIES: No results found.  ASSESSMENT AND PLAN: This is a very pleasant 27 years old African-American female who was initially evaluated for leukocytopenia and that showed to be autoimmune/drug-induced in origin but the patient was also found to have low folic acid level. Repeat CBC today showed resolution of her leukocytopenia.  She continues to have mild neutropenia. I recommended for her to continue on observation and routine monitoring by her primary care provider at this point. For the folic acid deficiency, I advised the patient to take over-the-counter folic acid 1 tablet daily. I will see her in as-needed basis at this point.  She was advised to call if she has any concerning symptoms in the interval. The patient voices understanding of current disease status and treatment options and is in agreement with the current care plan.  All questions were answered. The patient knows to call the clinic with any problems, questions or concerns. We can certainly see the patient much sooner if necessary.  The total time spent in the appointment was 20 minutes.  Disclaimer: This note was dictated with voice recognition software. Similar sounding words can inadvertently be transcribed and may not be corrected upon review.

## 2023-03-25 ENCOUNTER — Encounter (INDEPENDENT_AMBULATORY_CARE_PROVIDER_SITE_OTHER): Payer: Self-pay | Admitting: Primary Care

## 2023-03-25 ENCOUNTER — Telehealth (INDEPENDENT_AMBULATORY_CARE_PROVIDER_SITE_OTHER): Payer: 59 | Admitting: Primary Care

## 2023-03-25 ENCOUNTER — Ambulatory Visit (INDEPENDENT_AMBULATORY_CARE_PROVIDER_SITE_OTHER): Payer: Self-pay | Admitting: *Deleted

## 2023-03-25 DIAGNOSIS — U071 COVID-19: Secondary | ICD-10-CM | POA: Diagnosis not present

## 2023-03-25 MED ORDER — NIRMATRELVIR/RITONAVIR (PAXLOVID)TABLET: 3.0000 | ORAL_TABLET | Freq: Two times a day (BID) | ORAL | 0 refills | Status: AC

## 2023-03-25 NOTE — Telephone Encounter (Signed)
Pt had virtual appt with provider today

## 2023-03-25 NOTE — Telephone Encounter (Signed)
  Chief Complaint: covid positive at home test Symptoms: dry cough, headache, sore throat, hurts to breathe in, hx neutropenic precautions Frequency: yesterday  Pertinent Negatives: Patient denies chest pain no difficulty breathing no fever Disposition: [] ED /[] Urgent Care (no appt availability in office) / [x] Appointment(In office/virtual)/ []  [place] [facility]/ [] Home Care/ [] Refused Recommended Disposition /[] [place] [facility]/ []  Follow-up with PCP Additional Notes:   My chart VV scheduled today . Recommended if sx worsen go to ED.       Reason for Disposition  [1] HIGH RISK patient (e.g., weak immune system, age > 64 years, obesity with BMI 30 or higher, pregnant, chronic lung disease or other chronic medical condition) AND [2] COVID symptoms (e.g., cough, fever)  (Exceptions: Already seen by PCP and no new or worsening symptoms.)  Answer Assessment - Initial Assessment Questions 1. COVID-19 DIAGNOSIS: "How do you know that you have COVID?" (e.g., positive lab test or self-test, diagnosed by doctor or NP/PA, symptoms after exposure).     At covid home positive  2. COVID-19 EXPOSURE: "Was there any known exposure to COVID before the symptoms began?" CDC Definition of close contact: within 6 feet (2 meters) for a total of 15 minutes or more over a 24-hour period.      Works at [employer] , went to [employer] 3. ONSET: "When did the COVID-19 symptoms start?"      Yesterday  4. WORST SYMPTOM: "What is your worst symptom?" (e.g., cough, fever, shortness of breath, muscle aches)     Body aches, cough, sore throat headaches, hurts to breath in  5. COUGH: "Do you have a cough?" If Yes, ask: "How bad is the cough?"       Dry cough 6. FEVER: "Do you have a fever?" If Yes, ask: "What is your temperature, how was it measured, and when did it start?"     Na  7. RESPIRATORY STATUS: "Describe your breathing?" (e.g., normal; shortness of breath, wheezing, unable to speak)      Normal  8.  BETTER-SAME-WORSE: "Are you getting better, staying the same or getting worse compared to yesterday?"  If getting worse, ask, "In what way?"     Worse today  9. OTHER SYMPTOMS: "Do you have any other symptoms?"  (e.g., chills, fatigue, headache, loss of smell or taste, muscle pain, sore throat)     Headache , chest pain with breathing in , sore throat, body aches ,  10. HIGH RISK DISEASE: "Do you have any chronic medical problems?" (e.g., asthma, heart or lung disease, weak immune system, obesity, etc.)       Hx neutropenic precautions  11. VACCINE: "Have you had the COVID-19 vaccine?" If Yes, ask: "Which one, how many shots, when did you get it?"       Yes  12. PREGNANCY: "Is there any chance you are pregnant?" "When was your last menstrual period?"       na 13. O2 SATURATION MONITOR:  "Do you use an oxygen saturation monitor (pulse oximeter) at home?" If Yes, ask "What is your reading (oxygen level) today?" "What is your usual oxygen saturation reading?" (e.g., 95%)       na  Protocols used: Coronavirus (COVID-19) Diagnosed or Suspected-A-AH

## 2023-03-25 NOTE — Progress Notes (Signed)
  [facility]  Virtual Visit I connected with Carol Hayes, on 03/25/2023 at 3:41 PM  video application  and verified that I am speaking with the correct person using two identifiers.   Consent: I discussed the limitations, risks, security and privacy concerns of performing an evaluation and management service by telephone and the availability of in person appointments. I also discussed with the patient that there may be a patient responsible charge related to this service. The patient expressed understanding and agreed to proceed.   Location of Patient: Home  Location of Provider: [facility]   Persons participating in Telemedicine visit: Abbe Amsterdam,  NP   History of Present Illness: Carol Hayes is a 27 year old female with respiratory issues tested herself test reveal COVID-positive.  Asked patient to add it to her MyChart to place in the chart.   Past Medical History:  Diagnosis Date   Angio-edema    Anxiety    Asthma    Depression    Urticaria    Allergies  Allergen Reactions   Kiwi Extract Anaphylaxis, Hives and Itching   Tape Rash   Latex     Latex adhesive    Current Outpatient Medications on File Prior to Visit  Medication Sig Dispense Refill   albuterol (PROVENTIL HFA;VENTOLIN HFA) 108 (90 Base) MCG/ACT inhaler Inhale into the lungs every 6 (six) hours as needed for wheezing or shortness of breath.     diphenhydrAMINE (BENADRYL) 25 mg capsule Take 25 mg by mouth every 4 (four) hours as needed for allergies.     EPINEPHrine 0.3 mg/0.3 mL IJ SOAJ injection Inject 0.3 mg into the muscle as needed for anaphylaxis. 1 each 1   escitalopram (LEXAPRO) 10 MG tablet Take 1 tablet (10 mg total) by mouth daily. 90 tablet 1   famotidine (PEPCID) 20 MG tablet Take 1 tablet (20 mg total) by mouth 2 (two) times daily. 60 tablet 5   hydrOXYzine (ATARAX) 10 MG tablet Take 1  tablet (10 mg total) by mouth every 8 (eight) hours as needed for itching or anxiety. 60 tablet 5   montelukast (SINGULAIR) 10 MG tablet Take 1 tablet (10 mg total) by mouth at bedtime. 30 tablet 5   No current facility-administered medications on file prior to visit.    Observations/Objective: There were no vitals taken for this visit.   Assessment and Plan: COVID-positive  nirmatrelvir/ritonavir (PAXLOVID) 20 x 150 MG & 10 x 100MG  TAB   Follow Up Instructions: Isolation for 5 days and drink plenty of water and try to stay hydrated any increase in fever chills seek care at the emergency room   I discussed the assessment and treatment plan with the patient. The patient was provided an opportunity to ask questions and all were answered. The patient agreed with the plan and demonstrated an understanding of the instructions.   The patient was advised to call back or seek an in-person evaluation if the symptoms worsen or if the condition fails to improve as anticipated.     I provided 10 minutes total of non-face-to-face time during this encounter including median intraservice time, reviewing previous notes, investigations, ordering medications, medical decision making, coordinating care and patient verbalized understanding at the end of the visit.    This note has been created with Education officer, environmental. Any transcriptional errors are unintentional.   Grayce Sessions, NP 03/25/2023, 3:41 PM

## 2023-03-28 ENCOUNTER — Telehealth (INDEPENDENT_AMBULATORY_CARE_PROVIDER_SITE_OTHER): Payer: Self-pay | Admitting: Primary Care

## 2023-03-28 NOTE — Telephone Encounter (Signed)
Pt is calling in because she wants to know how long she will be contagious with COVID and when it would be safe to go back to work. Please follow up with pt.

## 2023-03-28 NOTE — Telephone Encounter (Signed)
Will forward to provider  

## 2023-03-29 ENCOUNTER — Encounter (INDEPENDENT_AMBULATORY_CARE_PROVIDER_SITE_OTHER): Payer: Self-pay | Admitting: Primary Care

## 2023-03-29 ENCOUNTER — Telehealth (INDEPENDENT_AMBULATORY_CARE_PROVIDER_SITE_OTHER): Payer: Self-pay | Admitting: Primary Care

## 2023-03-29 NOTE — Telephone Encounter (Signed)
Pt is calling in requesting a doctor's note stating she is okay to go back to work after 03/29/23. Pt says she retested for COVID and is negative and needs the note to return to work.

## 2023-03-29 NOTE — Telephone Encounter (Signed)
Will forward to provider

## 2023-03-29 NOTE — Telephone Encounter (Signed)
Pt called requesting letter to get back to work. Pt stated she had been waiting for letter from provider since her last visit on 09/16. Sent pt a letter and after-visit summary to her email to present to her job so she can go back to work. Pt stated she will call on Monday if she needs any more proof to get back to work after 5 days of Covid quarantine.

## 2023-04-01 ENCOUNTER — Encounter (INDEPENDENT_AMBULATORY_CARE_PROVIDER_SITE_OTHER): Payer: Self-pay

## 2023-04-01 NOTE — Telephone Encounter (Signed)
Sent pt a MyChart message

## 2023-05-21 ENCOUNTER — Encounter: Payer: Self-pay | Admitting: Allergy & Immunology

## 2023-05-21 ENCOUNTER — Ambulatory Visit (INDEPENDENT_AMBULATORY_CARE_PROVIDER_SITE_OTHER): Payer: 59 | Admitting: Allergy & Immunology

## 2023-05-21 ENCOUNTER — Other Ambulatory Visit: Payer: Self-pay

## 2023-05-21 VITALS — BP 100/80 | HR 86 | Temp 98.2°F | Resp 16 | Ht 70.0 in | Wt 151.1 lb

## 2023-05-21 DIAGNOSIS — J4520 Mild intermittent asthma, uncomplicated: Secondary | ICD-10-CM | POA: Diagnosis not present

## 2023-05-21 DIAGNOSIS — L508 Other urticaria: Secondary | ICD-10-CM | POA: Diagnosis not present

## 2023-05-21 DIAGNOSIS — J3089 Other allergic rhinitis: Secondary | ICD-10-CM | POA: Diagnosis not present

## 2023-05-21 DIAGNOSIS — J302 Other seasonal allergic rhinitis: Secondary | ICD-10-CM

## 2023-05-21 NOTE — Patient Instructions (Addendum)
1. Chronic urticaria - Information on Xolair provided. - Consent signed.  - This will allow you to get off of the antihistamines in the long term. - I would recommend getting three injections before weaning off of the antihistamines.  - In the meantime, continue with suppressive dosing of antihistamines:   - Morning: Allegra (fexofenadine) 1-2 tablets + Pepcid (famotidine) 20mg   - Evening: Zyrtec (cetirizine) 2 tablets + Pepcid (famotidine) 20mg  + Singulair (montelukast) 10mg   2. Return in about 6 months (around 11/18/2023).    Please inform us of any Emergency Department visits, hospitalizations, or changes in symptoms. Call us before going to the ED for breathing or allergy symptoms since we might be able to fit you in for a sick visit. Feel free to contact us anytime with any questions, problems, or concerns.  It was a pleasure to see you again today!  Websites that have reliable patient information: 1. American Academy of Asthma, Allergy, and Immunology: www.aaaai.org 2. Food Allergy Research and Education (FARE): foodallergy.org 3. Mothers of Asthmatics: http://www.asthmacommunitynetwork.org 4. American College of Allergy, Asthma, and Immunology: www.acaai.org   COVID-19 Vaccine Information can be found at: PodExchange.nl For questions related to vaccine distribution or appointments, please email [email or web address] or call [phone].   We realize that you might be concerned about having an allergic reaction to the COVID19 vaccines. To help with that concern, WE ARE OFFERING THE COVID19 VACCINES IN OUR OFFICE! Ask the front desk for dates!     "Like" [place] on Facebook and Instagram for our latest updates!      A healthy democracy works best when [employer] participate! Make sure you are registered to vote! If you have moved or changed any of your contact information, you will need to get this updated before  voting!  In some cases, you MAY be able to register to vote online: [email or web address]

## 2023-05-21 NOTE — Progress Notes (Signed)
FOLLOW UP  Date of Service/Encounter:  05/21/23   Assessment:   Chronic urticaria - controlled with suppressive doses of antihistamines, but they return intermittently despite this  Mild intermittent asthma, uncomplicated - with normal spirometry today   Perennial and seasonal allergic rhinitis (dust mites, mountain cedar, cockroach)   Joint pains - with negative anti-CCP antibody   Neutropenia - sees hematology/oncology (Dr. Arbutus Ped)  Plan/Recommendations:   1. Chronic urticaria - Information on Xolair provided. - Consent signed.  - This will allow you to get off of the antihistamines in the long term. - I would recommend getting three injections before weaning off of the antihistamines.  - In the meantime, continue with suppressive dosing of antihistamines:   - Morning: Allegra (fexofenadine) 1-2 tablets + Pepcid (famotidine) 20mg   - Evening: Zyrtec (cetirizine) 2 tablets + Pepcid (famotidine) 20mg  + Singulair (montelukast) 10mg   2. Return in about 6 months (around 11/18/2023).    Subjective:   Carol Hayes is a 27 y.o. female presenting today for follow up of  Chief Complaint  Patient presents with   Urticaria    No concerns doing well.    Carol Hayes has a history of the following: Patient Active Problem List   Diagnosis Date Noted   Leukocytopenia 03/18/2023   Paragard IUD (intrauterine device) in place since 2021 06/04/2022    History obtained from: chart review and patient.  Discussed the use of AI scribe software for clinical note transcription with the patient and/or guardian, who gave verbal consent to proceed.  Carol Hayes is a 27 y.o. female presenting for a follow up visit.  We last saw her in August 2024.  At that time, we obtained some thyroid labs that she had elevated antithyroid antibodies.  We did recheck her inflammatory markers and ANA as well.  We continued with Allegra and Pepcid in the morning with Zyrtec and Pepcid and Singulair  at night.  We did talk about Xolair for long-term control.  Since the last visit, she has done fairly well.   She reports that their hives have been "pretty fair" with the current medication regimen. They note that consistency in taking the medication is key, as skipping a dose results in a flare-up of the hives. The patient expresses interest in a more aggressive treatment option, such as Xolair, to potentially reduce the number of medications they are currently taking.  Regarding their asthma, the patient reports that their breathing has been "pretty fair" and that they only reach for their albuterol inhaler approximately once a month. They deny needing steroids like prednisone and note that their asthma attacks have significantly decreased in frequency and severity over time.  The patient also reports episodes of excessive sneezing, occurring more frequently this year, with two episodes in the past month and a half. These episodes are accompanied by nasal congestion and eye discomfort, but no other symptoms. They have taken measures to mitigate dust mite allergies, including using an air purifier and dust mite covers. Testing was positive to dust mites, cockroach, and mountain cedar.   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 However, they have not experienced any seizures since that time and have not sought medical attention for this issue.  Lastly, the patient reports taking folic acid as prescribed by another physician and notes that their fatigue, a significant issue during their last visit, has improved. They attribute this improvement to better  self-care and adherence to their medication regimen.     Otherwise, there have been no changes to her past medical history, surgical history, family history, or social history.    Review of systems otherwise negative other  than that mentioned in the HPI.    Objective:   Pulse 86, height 5\' 10"  (1.778 m), weight 151 lb 1.6 oz (68.5 kg). Body mass index is 21.68 kg/m.    Physical Exam Vitals reviewed.  Constitutional:      Appearance: She is well-developed.     Comments: Talkative. Very lovely.   HENT:     Head: Normocephalic and atraumatic.     Right Ear: Tympanic membrane, ear canal and external ear normal. No drainage, swelling or tenderness. Tympanic membrane is not injected, scarred, erythematous, retracted or bulging.     Left Ear: Tympanic membrane, ear canal and external ear normal. No drainage, swelling or tenderness. Tympanic membrane is not injected, scarred, erythematous, retracted or bulging.     Nose: No nasal deformity, septal deviation, mucosal edema or rhinorrhea.     Right Turbinates: Enlarged, swollen and pale.     Left Turbinates: Enlarged, swollen and pale.     Right Sinus: No maxillary sinus tenderness or frontal sinus tenderness.     Left Sinus: No maxillary sinus tenderness or frontal sinus tenderness.     Comments: No nasal polyps noted.     Mouth/Throat:     Lips: Pink.     Mouth: Mucous membranes are moist. Mucous membranes are not pale and not dry.     Pharynx: Uvula midline.  Eyes:     General: Lids are normal. Allergic shiner present.        Right eye: No discharge.        Left eye: No discharge.     Conjunctiva/sclera: Conjunctivae normal.     Right eye: Right conjunctiva is not injected. No chemosis.    Left eye: Left conjunctiva is not injected. No chemosis.    Pupils: Pupils are equal, round, and reactive to light.  Cardiovascular:     Rate and Rhythm: Normal rate and regular rhythm.     Heart sounds: Normal heart sounds.  Pulmonary:     Effort: Pulmonary effort is normal. No tachypnea, accessory muscle usage or respiratory distress.     Breath sounds: Normal breath sounds. No wheezing, rhonchi or rales.     Comments: Moving air well in all lung fields.  No  increased work of breathing. Chest:     Chest wall: No tenderness.  Abdominal:     Tenderness: There is no abdominal tenderness. There is no guarding or rebound.  Lymphadenopathy:     Head:     Right side of head: No submandibular, tonsillar or occipital adenopathy.     Left side of head: No submandibular, tonsillar or occipital adenopathy.     Cervical: No cervical adenopathy.  Skin:    General: Skin is warm.     Capillary Refill: Capillary refill takes less than 2 seconds.     Coloration: Skin is not pale.     Findings: No abrasion, erythema, petechiae or rash. Rash is not papular, urticarial or vesicular.     Comments: No urticaria noted.   Neurological:     Mental Status: She is alert.  Psychiatric:        Behavior: Behavior is cooperative.      Diagnostic studies:    Spirometry: results normal (FEV1: 3.11/91%, FVC: 4.18/105%, FEV1/FVC: 74%).  Spirometry consistent with normal pattern.    Allergy Studies: none        Malachi Bonds, MD  [facility]

## 2023-05-22 NOTE — Addendum Note (Signed)
Addended by: Orson Aloe on: 05/22/2023 05:39 PM   Modules accepted: Orders

## 2023-10-08 ENCOUNTER — Ambulatory Visit (INDEPENDENT_AMBULATORY_CARE_PROVIDER_SITE_OTHER): Admitting: Primary Care

## 2023-10-09 ENCOUNTER — Other Ambulatory Visit (HOSPITAL_COMMUNITY)
Admission: RE | Admit: 2023-10-09 | Discharge: 2023-10-09 | Disposition: A | Discharge status: Home / Self Care | Source: Ambulatory Visit | Attending: Primary Care | Admitting: Primary Care

## 2023-10-09 ENCOUNTER — Ambulatory Visit (INDEPENDENT_AMBULATORY_CARE_PROVIDER_SITE_OTHER): Admitting: Primary Care

## 2023-10-09 VITALS — Wt 146.4 lb

## 2023-10-09 DIAGNOSIS — Z23 Encounter for immunization: Secondary | ICD-10-CM

## 2023-10-09 DIAGNOSIS — Z113 Encounter for screening for infections with a predominantly sexual mode of transmission: Secondary | ICD-10-CM | POA: Diagnosis present

## 2023-10-09 DIAGNOSIS — F4323 Adjustment disorder with mixed anxiety and depressed mood: Secondary | ICD-10-CM | POA: Diagnosis not present

## 2023-10-09 MED ORDER — ESCITALOPRAM OXALATE 10 MG PO TABS: 10.0000 mg | ORAL_TABLET | Freq: Every day | ORAL | 1 refills | Status: AC

## 2023-10-09 NOTE — Progress Notes (Signed)
 [facility]  Carol Hayes, is a 28 y.o. female  [id number]  [id number]  DOB - 08-15-1995  Chief Complaint  Patient presents with   std  testing        Subjective:  [phone] Carol Hayes is a 28 y.o. female here today for STD screening involved in a new relationship and is not sexually active at this time. No problems updated.  Comprehensive ROS Pertinent positive and negative noted in HPI   Allergies  Allergen Reactions   Kiwi Extract Anaphylaxis, Hives and Itching   Tape Rash   Latex     Latex adhesive    Past Medical History:  Diagnosis Date   Angio-edema    Anxiety    Asthma    Depression    Urticaria     Current Outpatient Medications on File Prior to Visit  Medication Sig Dispense Refill   albuterol (PROVENTIL HFA;VENTOLIN HFA) 108 (90 Base) MCG/ACT inhaler Inhale into the lungs every 6 (six) hours as needed for wheezing or shortness of breath.     diphenhydrAMINE (BENADRYL) 25 mg capsule Take 25 mg by mouth every 4 (four) hours as needed for allergies.     EPINEPHrine 0.3 mg/0.3 mL IJ SOAJ injection Inject 0.3 mg into the muscle as needed for anaphylaxis. 1 each 1   escitalopram (LEXAPRO) 10 MG tablet Take 1 tablet (10 mg total) by mouth daily. 90 tablet 1   famotidine (PEPCID) 20 MG tablet Take 1 tablet (20 mg total) by mouth 2 (two) times daily. 60 tablet 5   fexofenadine (ALLEGRA) 180 MG tablet Take 180 mg by mouth daily.     hydrOXYzine (ATARAX) 10 MG tablet Take 1 tablet (10 mg total) by mouth every 8 (eight) hours as needed for itching or anxiety. 60 tablet 5   montelukast (SINGULAIR) 10 MG tablet Take 1 tablet (10 mg total) by mouth at bedtime. 30 tablet 5   No current facility-administered medications on file prior to visit.   Health Maintenance  Topic Date Due   HPV Vaccine (1 - 3-dose series) Never done   DTaP/Tdap/Td vaccine (1 - Tdap) Never done   COVID-19 Vaccine (1 - 2024-25 season) 10/25/2023*    Pneumococcal Vaccination (1 of 2 - PCV) 10/08/2024*   Flu Shot  02/07/2024   Pap Smear  06/04/2025   Hepatitis C Screening  Completed   HIV Screening  Completed  *Topic was postponed. The date shown is not the original due date.    Objective:   Physical Exam Vitals reviewed.  Constitutional:      Appearance: Normal appearance.  HENT:     Head: Normocephalic.     Right Ear: Tympanic membrane, ear canal and external ear normal.     Left Ear: Tympanic membrane, ear canal and external ear normal.     Nose: Nose normal.     Mouth/Throat:     Mouth: Mucous membranes are moist.  Eyes:     Extraocular Movements: Extraocular movements intact.     Pupils: Pupils are equal, round, and reactive to light.  Cardiovascular:     Rate and Rhythm: Normal rate.  Pulmonary:     Effort: Pulmonary effort is normal.     Breath sounds: Normal breath sounds.  Abdominal:     General: Bowel sounds are normal.     Palpations: Abdomen is soft.  Musculoskeletal:        General: Normal range of motion.     Cervical back: Normal range of motion.  Skin:    General: Skin is warm and dry.  Neurological:     Mental Status: She is alert and oriented to person, place, and time.  Psychiatric:        Mood and Affect: Mood normal.        Behavior: Behavior normal.        Thought Content: Thought content normal.      Assessment & Plan    Zoriyah was seen today for std  testing .  Diagnoses and all orders for this visit:  Need for HPV vaccination  Screen for STD (sexually transmitted disease) -     Cervicovaginal ancillary only -     HIV antibody (with reflex)  Need for vaccination for DTaP -     Cancel: DTaP HepB IPV combined vaccine IM  Adjustment disorder with mixed anxiety and depressed mood -     escitalopram (LEXAPRO) 10 MG tablet; Take 1 tablet (10 mg total) by mouth daily.  Other orders -     Tdap vaccine greater than or equal to 7yo IM     Patient have been counseled extensively  about nutrition and exercise. Other issues discussed during this visit include: low cholesterol diet, weight control and daily exercise, foot care, annual eye examinations at Ophthalmology, importance of adherence with medications and regular follow-up. We also discussed long term complications of uncontrolled diabetes and hypertension.     The patient was given clear instructions to go to ER or return to medical center if symptoms don't improve, worsen or new problems develop. The patient verbalized understanding. The patient was told to call to get lab results if they haven't heard anything in the next week.   This note has been created with Education officer, environmental. Any transcriptional errors are unintentional.   Grayce Sessions, NP 10/09/2023, 2:13 PM

## 2023-10-10 LAB — HIV ANTIBODY (ROUTINE TESTING W REFLEX): HIV Screen 4th Generation wRfx: NONREACTIVE

## 2023-10-11 LAB — CERVICOVAGINAL ANCILLARY ONLY
Bacterial Vaginitis (gardnerella): NEGATIVE
Chlamydia: NEGATIVE
Comment: NEGATIVE
Comment: NEGATIVE
Comment: NEGATIVE
Comment: NORMAL
Neisseria Gonorrhea: NEGATIVE
Trichomonas: NEGATIVE

## 2023-10-13 ENCOUNTER — Encounter (INDEPENDENT_AMBULATORY_CARE_PROVIDER_SITE_OTHER): Payer: Self-pay | Admitting: Primary Care

## 2023-11-19 ENCOUNTER — Ambulatory Visit (INDEPENDENT_AMBULATORY_CARE_PROVIDER_SITE_OTHER): Payer: 59 | Admitting: Allergy & Immunology

## 2023-11-19 ENCOUNTER — Other Ambulatory Visit: Payer: Self-pay

## 2023-11-19 ENCOUNTER — Encounter: Payer: Self-pay | Admitting: Allergy & Immunology

## 2023-11-19 VITALS — BP 118/72 | HR 72 | Temp 97.9°F | Resp 16

## 2023-11-19 DIAGNOSIS — L508 Other urticaria: Secondary | ICD-10-CM

## 2023-11-19 DIAGNOSIS — J3089 Other allergic rhinitis: Secondary | ICD-10-CM | POA: Diagnosis not present

## 2023-11-19 DIAGNOSIS — J4520 Mild intermittent asthma, uncomplicated: Secondary | ICD-10-CM | POA: Diagnosis not present

## 2023-11-19 DIAGNOSIS — J302 Other seasonal allergic rhinitis: Secondary | ICD-10-CM | POA: Diagnosis not present

## 2023-11-19 NOTE — Progress Notes (Signed)
 FOLLOW UP  Date of Service/Encounter:  11/19/23   Assessment:   Chronic urticaria - better since moving in February 2025 (still on suppressive antihistamine)    Mild intermittent asthma, uncomplicated - with normal spirometry today   Perennial and seasonal allergic rhinitis (dust mites, mountain cedar, cockroach)    Joint pains - with negative anti-CCP antibody   Neutropenia - sees hematology/oncology (Dr. Marguerita Shih)  Plan/Recommendations:   1. Chronic urticaria - It seems that your symptoms are under good control with the antihistamines.  - Xolair consent signed at the last visit.  - I am sorry that we never reached out to you. - I will route this to Tammy who should reach out to you.  - This will allow you to get off of the antihistamines in the long term. - I would recommend getting three injections before weaning off of the antihistamines.  - In the meantime, continue with suppressive dosing of antihistamines:   - Morning: Allegra (fexofenadine) 1-2 tablets  - Evening: Zyrtec  (cetirizine ) 1-2 tablets  2. Hand breakouts - We could do the food testing to look into this if you are interested.  - We can do the blood work OR we could do skin testing (but you would have to be off of the antihistamines for 3 days, which might make your hives worse). - We need to do testing BEFORE we start the Xolair.  - Add on Opzelura twice daily to help with the itching (sample provided). - This is a non-steroidal and is safe to use head to toe.   3. Return in about 3 months (around 02/19/2024). You can have the follow up appointment with Dr. Idolina Maker or a Nurse Practicioner (our Nurse Practitioners are excellent and always have Physician oversight!).  Subjective:   Marguriete Dassow is a 28 y.o. female presenting today for follow up of  Chief Complaint  Patient presents with   Follow-up    She is doing pretty good.  Asthma--sometimes she gets breathless when she is stationary. Have to  use inhaler more frequently this spring. Having breakout on her hands for the past 5 years. Have seen Derm before-had a biopsy done and showed autoimmune response. Does have some pain in hands.     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  Discussed the use of AI scribe software for clinical note transcription with the patient and/or guardian, who gave verbal consent to proceed.  Kortnie is a 28 y.o. female presenting for a follow up visit.  She was last seen in November 2024.  At that time, she signed consent for Xolair.  She continued Allegra and Pepcid  in the morning with Zyrtec , Pepcid , and Singulair  at night.  Her allergic rhinitis was under good control.  Since last visit, she has done relatrively well.   Her hives have become less frequent since moving to a new location in February. She continues to experience sporadic hives but notes they are not as consistent as before. She is currently taking Allegra in the morning and Zyrtec  at night, with a consistent dose of two tablets of Zyrtec . She stopped taking montelukast  approximately two months ago. Missing a dose of her antihistamines results in the return of hives. No current sinus infections or active hives at the time of the visit.  She describes a separate issue with her hands, which began in 2020. She experiences breakouts on both hands that sometimes itch and other times hurt, causing significant swelling. A biopsy was performed by dermatology, and she was previously given a cream about five years ago. She has not been using any current treatment for this condition. The swelling is localized to her fingers and sometimes extends to the sides of her hands. 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  She has undergone some blood tests in the past, but due to insurance issues, not all tests were completed. She works for the [employer] and also at [employer] on Saturdays.    Otherwise, there have been no changes to her past medical history, surgical history, family history, or social history.    Review of systems otherwise negative other than that mentioned in the HPI.    Objective:   Blood pressure 118/72, pulse 72, temperature 97.9 F (36.6 C), temperature source Temporal, resp. rate 16, SpO2 98%. There is no height or weight on file to calculate BMI.    Physical Exam Vitals reviewed.  Constitutional:      Appearance: She is well-developed.     Comments: Talkative. Very lovely.   HENT:     Head: Normocephalic and atraumatic.     Right Ear: Tympanic membrane, ear canal and external ear normal. No drainage, swelling or tenderness. Tympanic membrane is not injected, scarred, erythematous, retracted or bulging.     Left Ear: Tympanic membrane, ear canal and external ear normal. No drainage, swelling or tenderness. Tympanic membrane is not injected, scarred, erythematous, retracted or bulging.     Nose: No nasal deformity, septal deviation, mucosal edema or rhinorrhea.     Right Turbinates: Enlarged, swollen and pale.     Left Turbinates: Enlarged, swollen and pale.     Right Sinus: No maxillary sinus tenderness or frontal sinus tenderness.     Left Sinus: No maxillary sinus tenderness or frontal sinus tenderness.     Comments: No nasal polyps noted.     Mouth/Throat:     Lips: Pink.     Mouth: Mucous membranes are moist. Mucous membranes are not pale and not dry.     Pharynx: Uvula midline.  Eyes:     General: Lids are normal. Allergic shiner present.        Right eye: No discharge.        Left eye: No discharge.     Conjunctiva/sclera: Conjunctivae normal.     Right eye: Right conjunctiva is not injected. No chemosis.    Left  eye: Left conjunctiva is not injected. No chemosis.    Pupils: Pupils are equal, round, and reactive to light.  Cardiovascular:     Rate and Rhythm: Normal rate and regular rhythm.     Heart sounds: Normal heart sounds.  Pulmonary:     Effort: Pulmonary effort is normal. No tachypnea, accessory muscle usage or respiratory distress.     Breath sounds: Normal breath sounds. No wheezing, rhonchi or rales.     Comments: Moving air well in all lung fields.  No increased work of breathing. Chest:     Chest wall: No tenderness.  Abdominal:     Tenderness: There is no abdominal tenderness. There is no guarding or rebound.  Lymphadenopathy:     Head:     Right side of head: No submandibular, tonsillar or occipital adenopathy.  Left side of head: No submandibular, tonsillar or occipital adenopathy.     Cervical: No cervical adenopathy.  Skin:    General: Skin is warm.     Capillary Refill: Capillary refill takes less than 2 seconds.     Coloration: Skin is not pale.     Findings: No abrasion, erythema, petechiae or rash. Rash is not papular, urticarial or vesicular.     Comments: No urticaria noted.   Neurological:     Mental Status: She is alert.  Psychiatric:        Behavior: Behavior is cooperative.      Diagnostic studies:    Spirometry: results normal (FEV1: 3.18/93%, FVC: 4.40/110%, FEV1/FVC: 72%).    Spirometry consistent with normal pattern.   Allergy Studies: none       Drexel Gentles, MD  [facility] 

## 2023-11-19 NOTE — Patient Instructions (Addendum)
 1. Chronic urticaria - It seems that your symptoms are under good control with the antihistamines.  - Xolair consent signed at the last visit.  - I am sorry that we never reached out to you. - I will route this to Tammy who should reach out to you.  - This will allow you to get off of the antihistamines in the long term. - I would recommend getting three injections before weaning off of the antihistamines.  - In the meantime, continue with suppressive dosing of antihistamines:   - Morning: Allegra (fexofenadine) 1-2 tablets  - Evening: Zyrtec  (cetirizine ) 1-2 tablets  2. Hand breakouts - We could do the food testing to look into this if you are interested.  - We can do the blood work OR we could do skin testing (but you would have to be off of the antihistamines for 3 days, which might make your hives worse). - We need to do testing BEFORE we start the Xolair.  - Add on Opzelura twice daily to help with the itching (sample provided). - This is a non-steroidal and is safe to use head to toe.   3. Return in about 3 months (around 02/19/2024). You can have the follow up appointment with Dr. Idolina Maker or a Nurse Practicioner (our Nurse Practitioners are excellent and always have Physician oversight!).    Please inform us  of any Emergency Department visits, hospitalizations, or changes in symptoms. Call us  before going to the ED for breathing or allergy symptoms since we might be able to fit you in for a sick visit. Feel free to contact us  anytime with any questions, problems, or concerns.  It was a pleasure to see you again today!  Websites that have reliable patient information: 1. American Academy of Asthma, Allergy, and Immunology: www.aaaai.org 2. Food Allergy Research and Education (FARE): foodallergy.org 3. Mothers of Asthmatics: http://www.asthmacommunitynetwork.org 4. American College of Allergy, Asthma, and Immunology: www.acaai.org      "Like" us  on Facebook and Instagram for  our latest updates!      A healthy democracy works best when [employer] participate! Make sure you are registered to vote! If you have moved or changed any of your contact information, you will need to get this updated before voting! Scan the QR codes below to learn more!

## 2023-11-20 ENCOUNTER — Telehealth: Payer: Self-pay | Admitting: *Deleted

## 2023-11-20 NOTE — Telephone Encounter (Signed)
-----   Message from [place] Chuck sent at 11/19/2023  9:01 AM EDT ----- Xolair for CIU. No sample given. Consent signed back in November. I must have not sent it to you.

## 2023-11-20 NOTE — Telephone Encounter (Signed)
 Called patient and advised need updated Ins card and Rx card if she has same. She advised will upload to mychart

## 2023-12-19 ENCOUNTER — Ambulatory Visit: Payer: Self-pay | Admitting: *Deleted

## 2023-12-19 NOTE — Telephone Encounter (Signed)
 FYI Only or Action Required?: Action required by provider  Patient was last seen in primary care on 10/09/2023 by Marius Siemens, NP. Called Nurse Triage reporting Cough. Symptoms began a week ago. Interventions attempted: OTC medications: cough syrup, Neti pot  and Rest, hydration, or home remedies. Symptoms are: unchanged.  Triage Disposition: See Physician Within 24 Hours  Patient/caregiver understands and will follow disposition?: recommended UC                    Copied from CRM [id number]. Topic: Clinical - Red Word Triage >> Dec 19, 2023 11:12 AM Sophia H wrote: Red Word that prompted transfer to Nurse Triage: Patient states chest pain, congestion,headaches, bad cough. Feels her chest pain is constant, has been sick for the last week. No appointments available at clinic    ----------------------------------------------------------------------- From previous Reason for Contact - Scheduling: Patient/patient representative is calling to schedule an appointment. Refer to attachments for appointment information. Reason for Disposition  [1] Continuous (nonstop) coughing interferes with work or school AND [2] no improvement using cough treatment per Care Advice  Answer Assessment - Initial Assessment Questions 1. ONSET: When did the cough begin?      1 week  2. SEVERITY: How bad is the cough today?      Coughing spells that cause chest pain  3. SPUTUM: Describe the color of your sputum (none, dry cough; clear, white, yellow, green)     Yellow  4. HEMOPTYSIS: Are you coughing up any blood? If so ask: How much? (flecks, streaks, tablespoons, etc.)     Na  5. DIFFICULTY BREATHING: Are you having difficulty breathing? If Yes, ask: How bad is it? (e.g., mild, moderate, severe)    - MILD: No SOB at rest, mild SOB with walking, speaks normally in sentences, can lie down, no retractions, pulse < 100.    - MODERATE: SOB at rest, SOB with minimal exertion and  prefers to sit, cannot lie down flat, speaks in phrases, mild retractions, audible wheezing, pulse 100-120.    - SEVERE: Very SOB at rest, speaks in single words, struggling to breathe, sitting hunched forward, retractions, pulse > 120      No  6. FEVER: Do you have a fever? If Yes, ask: What is your temperature, how was it measured, and when did it start?     na 7. CARDIAC HISTORY: Do you have any history of heart disease? (e.g., heart attack, congestive heart failure)      na 8. LUNG HISTORY: Do you have any history of lung disease?  (e.g., pulmonary embolus, asthma, emphysema)     Hx asthma  9. PE RISK FACTORS: Do you have a history of blood clots? (or: recent major surgery, recent prolonged travel, bedridden)     Na  10. OTHER SYMPTOMS: Do you have any other symptoms? (e.g., runny nose, wheezing, chest pain)       Nasal congestion clear to yellow using Neti pot. Pressure pain nasal area. Chest pain with coughing , productive cough at times yellow sputum. Has tried taking OTC cough syrup  11. PREGNANCY: Is there any chance you are pregnant? When was your last menstrual period?       na 12. TRAVEL: Have you traveled out of the country in the last month? (e.g., travel history, exposures)       Na  No appt available with PCP until June 24. Mobile unit location not available today . Recommended UC due to chest pain with coughing .  Please advise if appt available .  Protocols used: Cough - Acute Productive-A-AH

## 2023-12-19 NOTE — Telephone Encounter (Signed)
 Pt has an upcoming appt with urgent care on 12/20/23

## 2023-12-20 ENCOUNTER — Ambulatory Visit (HOSPITAL_COMMUNITY): Payer: Self-pay

## 2023-12-23 NOTE — Telephone Encounter (Signed)
L/m for patient again to contact me ?

## 2023-12-27 ENCOUNTER — Ambulatory Visit

## 2023-12-31 NOTE — Telephone Encounter (Signed)
 Patient never responded to call for info needed for Xolair approval

## 2024-01-16 ENCOUNTER — Ambulatory Visit (INDEPENDENT_AMBULATORY_CARE_PROVIDER_SITE_OTHER): Admitting: Primary Care

## 2024-01-16 ENCOUNTER — Other Ambulatory Visit (HOSPITAL_COMMUNITY)
Admission: RE | Admit: 2024-01-16 | Discharge: 2024-01-16 | Disposition: A | Discharge status: Home / Self Care | Source: Ambulatory Visit | Attending: Primary Care | Admitting: Primary Care

## 2024-01-16 VITALS — BP 120/80 | HR 89 | Resp 16 | Wt 149.4 lb

## 2024-01-16 DIAGNOSIS — Z9189 Other specified personal risk factors, not elsewhere classified: Secondary | ICD-10-CM

## 2024-01-16 DIAGNOSIS — Z113 Encounter for screening for infections with a predominantly sexual mode of transmission: Secondary | ICD-10-CM | POA: Insufficient documentation

## 2024-01-16 DIAGNOSIS — Z23 Encounter for immunization: Secondary | ICD-10-CM

## 2024-01-16 DIAGNOSIS — N3289 Other specified disorders of bladder: Secondary | ICD-10-CM

## 2024-01-16 NOTE — Progress Notes (Signed)
 [facility]  Virtual Visit Note  I connected with Carol Hayes, on 01/16/2024 at 12:02 PM through an audio and video application and verified that I am speaking with the correct person using two identifiers.   Consent: I discussed the limitations, risks, security and privacy concerns of performing an evaluation and management service by mychart and the availability of in person appointments. I also discussed with the patient that there may be a patient responsible charge related to this service. The patient expressed understanding and agreed to proceed.   Location of Patient: Office /left   Location of Provider: [facility]   Persons participating in visit: Carol Bernis Carol Celestia,  NP History of Present Illness: Carol Hayes is a 28 year old female presented for dysuria - (in bath tube ecliptics, bath oils) than had unprotected sex. Cramping  and discomfort when bladder was full. Started question her relationship. [facility] and ancillary cervical. She was taking AZO so unable to check U/A but tx with flagyl and nitrofuran- completed.     Past Medical History:  Diagnosis Date   Angio-edema    Anxiety    Asthma    Depression    Urticaria    Allergies  Allergen Reactions   Kiwi Extract Anaphylaxis, Hives and Itching   Tape Rash   Latex     Latex adhesive    Current Outpatient Medications on File Prior to Visit  Medication Sig Dispense Refill   albuterol  (PROVENTIL  HFA;VENTOLIN  HFA) 108 (90 Base) MCG/ACT inhaler Inhale into the lungs every 6 (six) hours as needed for wheezing or shortness of breath.     diphenhydrAMINE (BENADRYL) 25 mg capsule Take 25 mg by mouth every 4 (four) hours as needed for allergies.     EPINEPHrine  0.3 mg/0.3 mL IJ SOAJ injection Inject 0.3 mg into the muscle as needed for anaphylaxis. 1 each 1   escitalopram  (LEXAPRO ) 10 MG  tablet Take 1 tablet (10 mg total) by mouth daily. 90 tablet 1   famotidine  (PEPCID ) 20 MG tablet Take 1 tablet (20 mg total) by mouth 2 (two) times daily. 60 tablet 5   fexofenadine (ALLEGRA) 180 MG tablet Take 180 mg by mouth daily.     hydrOXYzine  (ATARAX ) 10 MG tablet Take 1 tablet (10 mg total) by mouth every 8 (eight) hours as needed for itching or anxiety. 60 tablet 5   montelukast  (SINGULAIR ) 10 MG tablet Take 1 tablet (10 mg total) by mouth at bedtime. 30 tablet 5   No current facility-administered medications on file prior to visit.    Observations/Objective: BP 120/80   Pulse 89   Resp 16   Wt 149 lb 6.4 oz (67.8 kg)   SpO2 100%   BMI 21.44 kg/m    Assessment and Plan: Carol Hayes was seen today for std testing.  Diagnoses and all orders for this visit:  Screening for STD (sexually transmitted disease) -     Cervicovaginal ancillary only -     RPR  At risk for sexually transmitted disease due to unprotected sex -     RPR  Bladder irritation -     Urinalysis, Routine w reflex microscopic     Follow Up Instructions:    I discussed the assessment and treatment plan with the patient. The patient was provided an opportunity to ask questions and all were answered. The patient agreed with the plan and demonstrated an  understanding of the instructions.   The patient was advised to call back or seek an in-person evaluation if the symptoms worsen or if the condition fails to improve as anticipated.     I provided 20 minutes total time during this encounter including median intraservice time, reviewing previous notes, investigations, ordering medications, medical decision making, coordinating care and patient verbalized understanding at the end of the visit.    This note has been created with Education officer, environmental. Any transcriptional errors are unintentional.   Carol SHAUNNA Bohr, NP 01/16/2024, 12:02 PM

## 2024-01-17 ENCOUNTER — Ambulatory Visit: Payer: Self-pay | Admitting: Primary Care

## 2024-01-17 LAB — CERVICOVAGINAL ANCILLARY ONLY
Bacterial Vaginitis (gardnerella): NEGATIVE
Candida Glabrata: NEGATIVE
Candida Vaginitis: NEGATIVE
Chlamydia: NEGATIVE
Comment: NEGATIVE
Comment: NEGATIVE
Comment: NEGATIVE
Comment: NEGATIVE
Comment: NEGATIVE
Comment: NORMAL
Neisseria Gonorrhea: NEGATIVE
Trichomonas: NEGATIVE

## 2024-01-17 LAB — URINALYSIS, ROUTINE W REFLEX MICROSCOPIC
Bilirubin, UA: NEGATIVE
Glucose, UA: NEGATIVE
Ketones, UA: NEGATIVE
Leukocytes,UA: NEGATIVE
Nitrite, UA: NEGATIVE
Protein,UA: NEGATIVE
RBC, UA: NEGATIVE
Specific Gravity, UA: 1.023 (ref 1.005–1.030)
Urobilinogen, Ur: 0.2 mg/dL (ref 0.2–1.0)
pH, UA: 5.5 (ref 5.0–7.5)

## 2024-01-17 LAB — RPR: RPR Ser Ql: NONREACTIVE

## 2024-02-04 ENCOUNTER — Ambulatory Visit: Admitting: Nurse Practitioner

## 2024-02-25 ENCOUNTER — Ambulatory Visit: Payer: Self-pay | Admitting: Allergy & Immunology

## 2024-02-25 DIAGNOSIS — J309 Allergic rhinitis, unspecified: Secondary | ICD-10-CM
